# Patient Record
Sex: Male | Born: 1995 | Race: White | Hispanic: Yes | Marital: Single
Health system: Southern US, Community
[De-identification: ages and names within clinical notes are randomized; demographics above are authoritative.]

---
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
## 2020-01-16 ENCOUNTER — Other Ambulatory Visit: Payer: Self-pay

## 2020-01-16 ENCOUNTER — Encounter (HOSPITAL_COMMUNITY): Payer: Self-pay

## 2020-01-16 ENCOUNTER — Ambulatory Visit (HOSPITAL_COMMUNITY)
Admission: EM | Admit: 2020-01-16 | Discharge: 2020-01-16 | Disposition: A | Discharge status: Home / Self Care | Payer: Self-pay | Attending: Emergency Medicine | Admitting: Emergency Medicine

## 2020-01-16 DIAGNOSIS — H1031 Unspecified acute conjunctivitis, right eye: Secondary | ICD-10-CM

## 2020-01-16 DIAGNOSIS — T1591XA Foreign body on external eye, part unspecified, right eye, initial encounter: Secondary | ICD-10-CM

## 2020-01-16 MED ORDER — NEOMYCIN-POLYMYXIN-HC 3.5-10000-1 OP SUSP: 3.0000 [drp] | Freq: Four times a day (QID) | OPHTHALMIC | 0 refills | Status: AC

## 2020-01-16 NOTE — Discharge Instructions (Signed)
Use of the provided drops for the next week unless told otherwise by ophthalmology.  Please follow up with ophthalmology for recheck.

## 2020-01-16 NOTE — ED Provider Notes (Signed)
[facility]    CSN: [id number] Arrival date & time: 01/16/20  1451      History   Chief Complaint Chief Complaint  Patient presents with  . Eye Problem    HPI Binh Ardyth Harps is a 24 y.o. male.   Mivaan Ardyth Harps presents with complaints of right eye irritation with concern that there is a foreign body. He was outside yesterday when he felt a sudden onset of discomfort as if something got in the eye. Today it is red and tearing.  Tearing. No mattering. No vision changes. No photophobia. Doesn't wear contacts or glasses. States since sitting in exam room his eye feels improved.    ROS per HPI, negative if not otherwise mentioned.      History reviewed. No pertinent past medical history.  There are no problems to display for this patient.   History reviewed. No pertinent surgical history.     Home Medications    Prior to Admission medications   Medication Sig Start Date End Date Taking? Authorizing Provider  neomycin-polymyxin-hydrocortisone (CORTISPORIN) 3.5-10000-1 ophthalmic suspension Place 3 drops into the right eye 4 (four) times daily for 7 days. 01/16/20 01/23/20  Georgetta Haber, NP    Family History History reviewed. No pertinent family history.  Social History Social History   Tobacco Use  . Smoking status: Not on file  Substance Use Topics  . Alcohol use: Not on file  . Drug use: Not on file     Allergies   Patient has no known allergies.   Review of Systems Review of Systems   Physical Exam Triage Vital Signs ED Triage Vitals  Enc Vitals Group     BP 01/16/20 1531 140/83     Pulse Rate 01/16/20 1531 91     Resp 01/16/20 1531 18     Temp 01/16/20 1531 98.9 F (37.2 C)     Temp Source 01/16/20 1531 Oral     SpO2 01/16/20 1531 100 %     Weight --      Height --      Head Circumference --      Peak Flow --      Pain Score 01/16/20 1532 0     Pain Loc --      Pain Edu? --      Excl. in GC? --    No data  found.  Updated Vital Signs BP 140/83 (BP Location: Right Arm)   Pulse 91   Temp 98.9 F (37.2 C) (Oral)   Resp 18   SpO2 100%   Visual Acuity Right Eye Distance: 20/20 Left Eye Distance: 20/20 Bilateral Distance:    Right Eye Near:   Left Eye Near:    Bilateral Near:     Physical Exam Constitutional:      Appearance: He is well-developed.  Eyes:     General: Lids are normal. Lids are everted, no foreign bodies appreciated. Vision grossly intact.     Extraocular Movements: Extraocular movements intact.     Conjunctiva/sclera:     Right eye: Right conjunctiva is injected.     Pupils: Pupils are equal, round, and reactive to light.     Right eye: No fluorescein uptake.      Comments: Small black fleck to right lateral eye which is not mobile but also is not taking up fluorescein; eye nevus vs FB; conjunctiva is red; eye irrigated with wash as well as with wash cup without any change to the black fleck  Cardiovascular:     Rate and Rhythm: Normal rate.  Pulmonary:     Effort: Pulmonary effort is normal.  Skin:    General: Skin is warm and dry.  Neurological:     Mental Status: He is alert and oriented to person, place, and time.      UC Treatments / Results  Labs (all labs ordered are listed, but only abnormal results are displayed) Labs Reviewed - No data to display  EKG   Radiology No results found.  Procedures Procedures (including critical care time)  Medications Ordered in UC Medications - No data to display  Initial Impression / Assessment and Plan / UC Course  I have reviewed the triage vital signs and the nursing notes.  Pertinent labs & imaging results that were available during my care of the patient were reviewed by me and considered in my medical decision making (see chart for details).     Conjunctivitis with questionable foreign body to right lateral eye vs nevus? Follow up tomorrow with ophthalmology for recheck. Patient verbalized  understanding and agreeable to plan.   Final Clinical Impressions(s) / UC Diagnoses   Final diagnoses:  Acute conjunctivitis of right eye, unspecified acute conjunctivitis type  Foreign body of right eye, initial encounter     Discharge Instructions     Use of the provided drops for the next week unless told otherwise by ophthalmology.  Please follow up with ophthalmology for recheck.     ED Prescriptions    Medication Sig Dispense Auth. Provider   neomycin-polymyxin-hydrocortisone (CORTISPORIN) 3.5-10000-1 ophthalmic suspension Place 3 drops into the right eye 4 (four) times daily for 7 days. 7.5 mL Georgetta Haber, NP     PDMP not reviewed this encounter.   Georgetta Haber, NP 01/16/20 [phone]

## 2020-01-16 NOTE — ED Triage Notes (Signed)
Pt present right eye redness with itching and watery. Pt state there is a foreign object in his right eye. Pt notice this yesterday. Pt states he washed his eye out but the object is still located in his eye. Pt eye is red.

## 2021-01-27 ENCOUNTER — Emergency Department (HOSPITAL_COMMUNITY): Payer: No Typology Code available for payment source

## 2021-01-27 ENCOUNTER — Inpatient Hospital Stay (HOSPITAL_COMMUNITY)
Admission: EM | Admit: 2021-01-27 | Discharge: 2021-02-10 | DRG: 957 | Disposition: A | Discharge status: Home / Self Care | Payer: No Typology Code available for payment source | Attending: General Surgery | Admitting: General Surgery

## 2021-01-27 DIAGNOSIS — S42102A Fracture of unspecified part of scapula, left shoulder, initial encounter for closed fracture: Secondary | ICD-10-CM | POA: Diagnosis present

## 2021-01-27 DIAGNOSIS — Y92410 Unspecified street and highway as the place of occurrence of the external cause: Secondary | ICD-10-CM

## 2021-01-27 DIAGNOSIS — Z4682 Encounter for fitting and adjustment of non-vascular catheter: Secondary | ICD-10-CM

## 2021-01-27 DIAGNOSIS — S022XXA Fracture of nasal bones, initial encounter for closed fracture: Secondary | ICD-10-CM | POA: Diagnosis present

## 2021-01-27 DIAGNOSIS — S27331A Laceration of lung, unilateral, initial encounter: Secondary | ICD-10-CM | POA: Diagnosis present

## 2021-01-27 DIAGNOSIS — J9690 Respiratory failure, unspecified, unspecified whether with hypoxia or hypercapnia: Secondary | ICD-10-CM

## 2021-01-27 DIAGNOSIS — M40204 Unspecified kyphosis, thoracic region: Secondary | ICD-10-CM | POA: Diagnosis present

## 2021-01-27 DIAGNOSIS — J939 Pneumothorax, unspecified: Secondary | ICD-10-CM | POA: Diagnosis present

## 2021-01-27 DIAGNOSIS — S36112A Contusion of liver, initial encounter: Secondary | ICD-10-CM | POA: Diagnosis present

## 2021-01-27 DIAGNOSIS — K760 Fatty (change of) liver, not elsewhere classified: Secondary | ICD-10-CM | POA: Diagnosis present

## 2021-01-27 DIAGNOSIS — S32029A Unspecified fracture of second lumbar vertebra, initial encounter for closed fracture: Secondary | ICD-10-CM | POA: Diagnosis present

## 2021-01-27 DIAGNOSIS — S42111A Displaced fracture of body of scapula, right shoulder, initial encounter for closed fracture: Secondary | ICD-10-CM

## 2021-01-27 DIAGNOSIS — Z20822 Contact with and (suspected) exposure to covid-19: Secondary | ICD-10-CM | POA: Diagnosis present

## 2021-01-27 DIAGNOSIS — J982 Interstitial emphysema: Secondary | ICD-10-CM | POA: Diagnosis present

## 2021-01-27 DIAGNOSIS — Z419 Encounter for procedure for purposes other than remedying health state, unspecified: Secondary | ICD-10-CM

## 2021-01-27 DIAGNOSIS — G9600 Cerebrospinal fluid leak, unspecified: Secondary | ICD-10-CM | POA: Diagnosis present

## 2021-01-27 DIAGNOSIS — S271XXA Traumatic hemothorax, initial encounter: Principal | ICD-10-CM | POA: Diagnosis present

## 2021-01-27 DIAGNOSIS — J9601 Acute respiratory failure with hypoxia: Secondary | ICD-10-CM | POA: Diagnosis present

## 2021-01-27 DIAGNOSIS — S32019A Unspecified fracture of first lumbar vertebra, initial encounter for closed fracture: Secondary | ICD-10-CM | POA: Diagnosis present

## 2021-01-27 DIAGNOSIS — S2241XA Multiple fractures of ribs, right side, initial encounter for closed fracture: Secondary | ICD-10-CM | POA: Diagnosis present

## 2021-01-27 DIAGNOSIS — S42115A Nondisplaced fracture of body of scapula, left shoulder, initial encounter for closed fracture: Secondary | ICD-10-CM

## 2021-01-27 DIAGNOSIS — M4325 Fusion of spine, thoracolumbar region: Secondary | ICD-10-CM | POA: Diagnosis present

## 2021-01-27 DIAGNOSIS — Z8709 Personal history of other diseases of the respiratory system: Secondary | ICD-10-CM

## 2021-01-27 DIAGNOSIS — Z23 Encounter for immunization: Secondary | ICD-10-CM

## 2021-01-27 DIAGNOSIS — S27329A Contusion of lung, unspecified, initial encounter: Secondary | ICD-10-CM | POA: Diagnosis present

## 2021-01-27 DIAGNOSIS — S42021A Displaced fracture of shaft of right clavicle, initial encounter for closed fracture: Secondary | ICD-10-CM | POA: Diagnosis present

## 2021-01-27 DIAGNOSIS — S22068A Other fracture of T7-T8 thoracic vertebra, initial encounter for closed fracture: Secondary | ICD-10-CM | POA: Diagnosis present

## 2021-01-27 DIAGNOSIS — S27321A Contusion of lung, unilateral, initial encounter: Secondary | ICD-10-CM | POA: Diagnosis present

## 2021-01-27 DIAGNOSIS — S42009A Fracture of unspecified part of unspecified clavicle, initial encounter for closed fracture: Secondary | ICD-10-CM

## 2021-01-27 DIAGNOSIS — R000 Tachycardia, unspecified: Secondary | ICD-10-CM | POA: Diagnosis present

## 2021-01-27 DIAGNOSIS — I959 Hypotension, unspecified: Secondary | ICD-10-CM | POA: Diagnosis present

## 2021-01-27 DIAGNOSIS — S22088A Other fracture of T11-T12 vertebra, initial encounter for closed fracture: Secondary | ICD-10-CM | POA: Diagnosis present

## 2021-01-27 DIAGNOSIS — S42101A Fracture of unspecified part of scapula, right shoulder, initial encounter for closed fracture: Secondary | ICD-10-CM | POA: Diagnosis present

## 2021-01-27 DIAGNOSIS — R339 Retention of urine, unspecified: Secondary | ICD-10-CM | POA: Diagnosis not present

## 2021-01-27 DIAGNOSIS — T1490XA Injury, unspecified, initial encounter: Secondary | ICD-10-CM

## 2021-01-27 DIAGNOSIS — S22078A Other fracture of T9-T10 vertebra, initial encounter for closed fracture: Secondary | ICD-10-CM | POA: Diagnosis present

## 2021-01-27 MED ORDER — CEFAZOLIN SODIUM-DEXTROSE 1-4 GM/50ML-% IV SOLN
1.0000 g | Freq: Once | INTRAVENOUS | Status: AC
  Administered 2021-01-27: 1 g via INTRAVENOUS

## 2021-01-27 MED ORDER — ROCURONIUM BROMIDE 50 MG/5ML IV SOLN
INTRAVENOUS | Status: AC | PRN
  Administered 2021-01-27: 100 mg via INTRAVENOUS

## 2021-01-27 MED ORDER — SODIUM CHLORIDE 0.9 % IV SOLN
INTRAVENOUS | Status: AC | PRN
  Administered 2021-01-27: 999 mL/h via INTRAVENOUS

## 2021-01-27 MED ORDER — FENTANYL CITRATE PF 50 MCG/ML IJ SOSY
100.0000 ug | PREFILLED_SYRINGE | Freq: Once | INTRAMUSCULAR | Status: AC
  Administered 2021-01-27: 100 ug via INTRAVENOUS

## 2021-01-27 MED ORDER — IOHEXOL 350 MG/ML SOLN
100.0000 mL | Freq: Once | INTRAVENOUS | Status: AC | PRN
  Administered 2021-01-27: 100 mL via INTRAVENOUS

## 2021-01-27 MED ORDER — ETOMIDATE 2 MG/ML IV SOLN
INTRAVENOUS | Status: AC | PRN
  Administered 2021-01-27: 10 mg via INTRAVENOUS

## 2021-01-27 NOTE — Progress Notes (Signed)
Orthopedic Tech Progress Note Patient Details:  Korrey Schleicher 03-25-1995 [id number]  Patient ID: Kalman Nylen, male   DOB: 07-26-1995, 25 y.o.   MRN: [id number] I attended trauma page. Trinna Post 01/27/2021, 11:59 PM

## 2021-01-27 NOTE — ED Notes (Signed)
Patient transported to CT 
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
## 2021-01-28 ENCOUNTER — Encounter (HOSPITAL_COMMUNITY): Payer: Self-pay

## 2021-01-28 ENCOUNTER — Inpatient Hospital Stay (HOSPITAL_COMMUNITY): Payer: No Typology Code available for payment source

## 2021-01-28 DIAGNOSIS — S42101A Fracture of unspecified part of scapula, right shoulder, initial encounter for closed fracture: Secondary | ICD-10-CM | POA: Diagnosis present

## 2021-01-28 DIAGNOSIS — J939 Pneumothorax, unspecified: Secondary | ICD-10-CM | POA: Diagnosis present

## 2021-01-28 DIAGNOSIS — S36112A Contusion of liver, initial encounter: Secondary | ICD-10-CM | POA: Diagnosis present

## 2021-01-28 DIAGNOSIS — S32019A Unspecified fracture of first lumbar vertebra, initial encounter for closed fracture: Secondary | ICD-10-CM | POA: Diagnosis present

## 2021-01-28 DIAGNOSIS — S32029A Unspecified fracture of second lumbar vertebra, initial encounter for closed fracture: Secondary | ICD-10-CM | POA: Diagnosis present

## 2021-01-28 DIAGNOSIS — J982 Interstitial emphysema: Secondary | ICD-10-CM | POA: Diagnosis present

## 2021-01-28 DIAGNOSIS — S42111A Displaced fracture of body of scapula, right shoulder, initial encounter for closed fracture: Secondary | ICD-10-CM

## 2021-01-28 DIAGNOSIS — S42115A Nondisplaced fracture of body of scapula, left shoulder, initial encounter for closed fracture: Secondary | ICD-10-CM

## 2021-01-28 DIAGNOSIS — S42021A Displaced fracture of shaft of right clavicle, initial encounter for closed fracture: Secondary | ICD-10-CM | POA: Diagnosis present

## 2021-01-28 DIAGNOSIS — Z23 Encounter for immunization: Secondary | ICD-10-CM | POA: Diagnosis not present

## 2021-01-28 DIAGNOSIS — S271XXA Traumatic hemothorax, initial encounter: Secondary | ICD-10-CM | POA: Diagnosis present

## 2021-01-28 DIAGNOSIS — I959 Hypotension, unspecified: Secondary | ICD-10-CM | POA: Diagnosis present

## 2021-01-28 DIAGNOSIS — S022XXA Fracture of nasal bones, initial encounter for closed fracture: Secondary | ICD-10-CM | POA: Diagnosis present

## 2021-01-28 DIAGNOSIS — Z20822 Contact with and (suspected) exposure to covid-19: Secondary | ICD-10-CM | POA: Diagnosis present

## 2021-01-28 DIAGNOSIS — J9690 Respiratory failure, unspecified, unspecified whether with hypoxia or hypercapnia: Secondary | ICD-10-CM | POA: Diagnosis present

## 2021-01-28 DIAGNOSIS — S27321A Contusion of lung, unilateral, initial encounter: Secondary | ICD-10-CM | POA: Diagnosis present

## 2021-01-28 DIAGNOSIS — S22068A Other fracture of T7-T8 thoracic vertebra, initial encounter for closed fracture: Secondary | ICD-10-CM | POA: Diagnosis present

## 2021-01-28 DIAGNOSIS — S42102A Fracture of unspecified part of scapula, left shoulder, initial encounter for closed fracture: Secondary | ICD-10-CM | POA: Diagnosis present

## 2021-01-28 DIAGNOSIS — K760 Fatty (change of) liver, not elsewhere classified: Secondary | ICD-10-CM | POA: Diagnosis present

## 2021-01-28 DIAGNOSIS — S27331A Laceration of lung, unilateral, initial encounter: Secondary | ICD-10-CM | POA: Diagnosis present

## 2021-01-28 DIAGNOSIS — G9600 Cerebrospinal fluid leak, unspecified: Secondary | ICD-10-CM | POA: Diagnosis present

## 2021-01-28 DIAGNOSIS — S2241XA Multiple fractures of ribs, right side, initial encounter for closed fracture: Secondary | ICD-10-CM | POA: Diagnosis present

## 2021-01-28 DIAGNOSIS — R339 Retention of urine, unspecified: Secondary | ICD-10-CM | POA: Diagnosis not present

## 2021-01-28 DIAGNOSIS — S22078A Other fracture of T9-T10 vertebra, initial encounter for closed fracture: Secondary | ICD-10-CM | POA: Diagnosis present

## 2021-01-28 DIAGNOSIS — M40204 Unspecified kyphosis, thoracic region: Secondary | ICD-10-CM | POA: Diagnosis present

## 2021-01-28 DIAGNOSIS — S27329A Contusion of lung, unspecified, initial encounter: Secondary | ICD-10-CM | POA: Diagnosis present

## 2021-01-28 DIAGNOSIS — S22088A Other fracture of T11-T12 vertebra, initial encounter for closed fracture: Secondary | ICD-10-CM | POA: Diagnosis present

## 2021-01-28 DIAGNOSIS — J9601 Acute respiratory failure with hypoxia: Secondary | ICD-10-CM | POA: Diagnosis present

## 2021-01-28 DIAGNOSIS — Y92410 Unspecified street and highway as the place of occurrence of the external cause: Secondary | ICD-10-CM | POA: Diagnosis not present

## 2021-01-28 DIAGNOSIS — S42009A Fracture of unspecified part of unspecified clavicle, initial encounter for closed fracture: Secondary | ICD-10-CM

## 2021-01-28 LAB — URINALYSIS, ROUTINE W REFLEX MICROSCOPIC
Bilirubin Urine: NEGATIVE
Glucose, UA: NEGATIVE mg/dL
Ketones, ur: NEGATIVE mg/dL
Leukocytes,Ua: NEGATIVE
Nitrite: NEGATIVE
Protein, ur: NEGATIVE mg/dL
Specific Gravity, Urine: 1.004 — ABNORMAL LOW (ref 1.005–1.030)
pH: 5 (ref 5.0–8.0)

## 2021-01-28 LAB — I-STAT ARTERIAL BLOOD GAS, ED
Acid-base deficit: 14 mmol/L — ABNORMAL HIGH (ref 0.0–2.0)
Bicarbonate: 14.6 mmol/L — ABNORMAL LOW (ref 20.0–28.0)
Calcium, Ion: 1.01 mmol/L — ABNORMAL LOW (ref 1.15–1.40)
HCT: 43 % (ref 39.0–52.0)
Hemoglobin: 14.6 g/dL (ref 13.0–17.0)
O2 Saturation: 99 %
Patient temperature: 97.1
Potassium: 2.8 mmol/L — ABNORMAL LOW (ref 3.5–5.1)
Sodium: 146 mmol/L — ABNORMAL HIGH (ref 135–145)
TCO2: 16 mmol/L — ABNORMAL LOW (ref 22–32)
pCO2 arterial: 43.1 mmHg (ref 32.0–48.0)
pH, Arterial: 7.133 — CL (ref 7.350–7.450)
pO2, Arterial: 155 mmHg — ABNORMAL HIGH (ref 83.0–108.0)

## 2021-01-28 LAB — TYPE AND SCREEN
ABO/RH(D): O POS
Antibody Screen: NEGATIVE
Unit division: 0
Unit division: 0

## 2021-01-28 LAB — COMPREHENSIVE METABOLIC PANEL
ALT: 1244 U/L — ABNORMAL HIGH (ref 0–44)
AST: 1395 U/L — ABNORMAL HIGH (ref 15–41)
Albumin: 4 g/dL (ref 3.5–5.0)
Alkaline Phosphatase: 151 U/L — ABNORMAL HIGH (ref 38–126)
Anion gap: 20 — ABNORMAL HIGH (ref 5–15)
BUN: 8 mg/dL (ref 6–20)
CO2: 12 mmol/L — ABNORMAL LOW (ref 22–32)
Calcium: 8.4 mg/dL — ABNORMAL LOW (ref 8.9–10.3)
Chloride: 111 mmol/L (ref 98–111)
Creatinine, Ser: 1.03 mg/dL (ref 0.61–1.24)
GFR, Estimated: >60 mL/min (ref >60)
Glucose, Bld: 187 mg/dL — ABNORMAL HIGH (ref 70–99)
Potassium: 3.2 mmol/L — ABNORMAL LOW (ref 3.5–5.1)
Sodium: 143 mmol/L (ref 135–145)
Total Bilirubin: 0.5 mg/dL (ref 0.3–1.2)
Total Protein: 7.4 g/dL (ref 6.5–8.1)

## 2021-01-28 LAB — I-STAT CHEM 8, ED
BUN: 8 mg/dL (ref 6–20)
Calcium, Ion: 0.94 mmol/L — ABNORMAL LOW (ref 1.15–1.40)
Chloride: 113 mmol/L — ABNORMAL HIGH (ref 98–111)
Creatinine, Ser: 1.3 mg/dL — ABNORMAL HIGH (ref 0.61–1.24)
Glucose, Bld: 179 mg/dL — ABNORMAL HIGH (ref 70–99)
HCT: 47 % (ref 39.0–52.0)
Hemoglobin: 16 g/dL (ref 13.0–17.0)
Potassium: 3.2 mmol/L — ABNORMAL LOW (ref 3.5–5.1)
Sodium: 144 mmol/L (ref 135–145)
TCO2: 14 mmol/L — ABNORMAL LOW (ref 22–32)

## 2021-01-28 LAB — CBC
HCT: 46.1 % (ref 39.0–52.0)
HCT: 48 % (ref 39.0–52.0)
HCT: 54.7 % — ABNORMAL HIGH (ref 39.0–52.0)
Hemoglobin: 15.3 g/dL (ref 13.0–17.0)
Hemoglobin: 16.7 g/dL (ref 13.0–17.0)
Hemoglobin: 17.9 g/dL — ABNORMAL HIGH (ref 13.0–17.0)
MCH: 29.4 pg (ref 26.0–34.0)
MCH: 29.4 pg (ref 26.0–34.0)
MCH: 30.2 pg (ref 26.0–34.0)
MCHC: 32.7 g/dL (ref 30.0–36.0)
MCHC: 33.2 g/dL (ref 30.0–36.0)
MCHC: 34.8 g/dL (ref 30.0–36.0)
MCV: 86.8 fL (ref 80.0–100.0)
MCV: 88.5 fL (ref 80.0–100.0)
MCV: 90 fL (ref 80.0–100.0)
Platelets: 232 10*3/uL (ref 150–400)
Platelets: 300 10*3/uL (ref 150–400)
Platelets: 398 10*3/uL (ref 150–400)
RBC: 5.21 MIL/uL (ref 4.22–5.81)
RBC: 5.53 MIL/uL (ref 4.22–5.81)
RBC: 6.08 MIL/uL — ABNORMAL HIGH (ref 4.22–5.81)
RDW: 13.2 % (ref 11.5–15.5)
RDW: 13.3 % (ref 11.5–15.5)
RDW: 14 % (ref 11.5–15.5)
WBC: 14.9 10*3/uL — ABNORMAL HIGH (ref 4.0–10.5)
WBC: 16 10*3/uL — ABNORMAL HIGH (ref 4.0–10.5)
WBC: 6.5 10*3/uL (ref 4.0–10.5)
nRBC: 0 % (ref 0.0–0.2)
nRBC: 0 % (ref 0.0–0.2)
nRBC: 0.5 % — ABNORMAL HIGH (ref 0.0–0.2)

## 2021-01-28 LAB — RESP PANEL BY RT-PCR (FLU A&B, COVID) ARPGX2
Influenza A by PCR: NEGATIVE
Influenza B by PCR: NEGATIVE
SARS Coronavirus 2 by RT PCR: NEGATIVE

## 2021-01-28 LAB — BPAM RBC
Blood Product Expiration Date: 202212042359
Blood Product Expiration Date: 202212182359
ISSUE DATE / TIME: 202211262333
ISSUE DATE / TIME: 202211262338
Unit Type and Rh: 5100
Unit Type and Rh: 5100

## 2021-01-28 LAB — ETHANOL: Alcohol, Ethyl (B): 322 mg/dL (ref <10)

## 2021-01-28 LAB — PROTIME-INR
INR: 1.1 (ref 0.8–1.2)
Prothrombin Time: 14.1 seconds (ref 11.4–15.2)

## 2021-01-28 LAB — LACTIC ACID, PLASMA
Lactic Acid, Venous: 6.1 mmol/L (ref 0.5–1.9)
Lactic Acid, Venous: 3.2 mmol/L (ref 0.5–1.9)
Lactic Acid, Venous: 2.4 mmol/L (ref 0.5–1.9)

## 2021-01-28 LAB — RAPID URINE DRUG SCREEN, HOSP PERFORMED
Amphetamines: NOT DETECTED
Barbiturates: NOT DETECTED
Benzodiazepines: NOT DETECTED
Cocaine: NOT DETECTED
Opiates: NOT DETECTED
Tetrahydrocannabinol: NOT DETECTED

## 2021-01-28 LAB — ABO/RH: ABO/RH(D): O POS

## 2021-01-28 LAB — HIV ANTIBODY (ROUTINE TESTING W REFLEX): HIV Screen 4th Generation wRfx: NONREACTIVE

## 2021-01-28 LAB — BLOOD PRODUCT ORDER (VERBAL) VERIFICATION

## 2021-01-28 LAB — MRSA NEXT GEN BY PCR, NASAL: MRSA by PCR Next Gen: NOT DETECTED

## 2021-01-28 MED ORDER — SODIUM CHLORIDE 0.9 % IV SOLN: INTRAVENOUS | Status: DC

## 2021-01-28 MED ORDER — ACETAMINOPHEN 325 MG PO TABS
650.0000 mg | ORAL_TABLET | ORAL | Status: DC | PRN
  Administered 2021-01-28: 650 mg
  Filled 2021-01-28: qty 2

## 2021-01-28 MED ORDER — FENTANYL 2500MCG IN NS 250ML (10MCG/ML) PREMIX INFUSION
50.0000 ug/h | INTRAVENOUS | Status: DC
Start: 2021-01-28 — End: 2021-02-02
  Administered 2021-01-28: 50 ug/h via INTRAVENOUS
  Administered 2021-01-29: 100 ug/h via INTRAVENOUS
  Administered 2021-01-29 – 2021-02-01 (×4): 150 ug/h via INTRAVENOUS
  Administered 2021-02-01: 175 ug/h via INTRAVENOUS
  Administered 2021-02-02: 150 ug/h via INTRAVENOUS
  Filled 2021-01-28 (×8): qty 250

## 2021-01-28 MED ORDER — POTASSIUM CHLORIDE 20 MEQ PO PACK
40.0000 meq | PACK | Freq: Two times a day (BID) | ORAL | Status: AC
  Administered 2021-01-28: 40 meq
  Filled 2021-01-28: qty 2

## 2021-01-28 MED ORDER — METOPROLOL TARTRATE 5 MG/5ML IV SOLN: 5.0000 mg | Freq: Four times a day (QID) | INTRAVENOUS | Status: DC | PRN

## 2021-01-28 MED ORDER — FENTANYL BOLUS VIA INFUSION
50.0000 ug | INTRAVENOUS | Status: DC | PRN
  Administered 2021-01-28 – 2021-02-02 (×8): 100 ug via INTRAVENOUS
  Filled 2021-01-28: qty 100

## 2021-01-28 MED ORDER — CHLORHEXIDINE GLUCONATE 0.12% ORAL RINSE (MEDLINE KIT): 15.0000 mL | Freq: Two times a day (BID) | OROMUCOSAL | Status: DC

## 2021-01-28 MED ORDER — ENOXAPARIN SODIUM 30 MG/0.3ML IJ SOSY
30.0000 mg | PREFILLED_SYRINGE | Freq: Two times a day (BID) | INTRAMUSCULAR | Status: DC
  Filled 2021-01-28 (×2): qty 0.3

## 2021-01-28 MED ORDER — ORAL CARE MOUTH RINSE: 15.0000 mL | OROMUCOSAL | Status: DC

## 2021-01-28 MED ORDER — CHLORHEXIDINE GLUCONATE CLOTH 2 % EX PADS: 6.0000 | MEDICATED_PAD | Freq: Every day | CUTANEOUS | Status: DC

## 2021-01-28 MED ORDER — SODIUM CHLORIDE 0.9 % IV BOLUS
1000.0000 mL | Freq: Once | INTRAVENOUS | Status: AC
  Administered 2021-01-28: 1000 mL via INTRAVENOUS

## 2021-01-28 MED ORDER — ACETAMINOPHEN 160 MG/5ML PO SOLN
325.0000 mg | Freq: Four times a day (QID) | ORAL | Status: DC
  Administered 2021-01-28 – 2021-01-29 (×6): 325 mg
  Filled 2021-01-28 (×5): qty 20.3

## 2021-01-28 MED ORDER — PANTOPRAZOLE SODIUM 40 MG PO TBEC: 40.0000 mg | DELAYED_RELEASE_TABLET | Freq: Every day | ORAL | Status: DC

## 2021-01-28 MED ORDER — POTASSIUM CHLORIDE 20 MEQ PO PACK: 40.0000 meq | PACK | Freq: Two times a day (BID) | ORAL | Status: DC

## 2021-01-28 MED ORDER — CALCIUM GLUCONATE-NACL 2-0.675 GM/100ML-% IV SOLN
2.0000 g | Freq: Once | INTRAVENOUS | Status: AC
  Administered 2021-01-28: 2000 mg via INTRAVENOUS
  Filled 2021-01-28: qty 100

## 2021-01-28 MED ORDER — ORAL CARE MOUTH RINSE
15.0000 mL | OROMUCOSAL | Status: DC
  Administered 2021-01-28 – 2021-02-02 (×53): 15 mL via OROMUCOSAL

## 2021-01-28 MED ORDER — ACETAMINOPHEN 325 MG PO TABS: 650.0000 mg | ORAL_TABLET | ORAL | Status: DC | PRN

## 2021-01-28 MED ORDER — ONDANSETRON 4 MG PO TBDP: 4.0000 mg | ORAL_TABLET | Freq: Four times a day (QID) | ORAL | Status: DC | PRN

## 2021-01-28 MED ORDER — POLYETHYLENE GLYCOL 3350 17 G PO PACK
17.0000 g | PACK | Freq: Every day | ORAL | Status: DC
  Administered 2021-01-28 – 2021-02-01 (×4): 17 g
  Filled 2021-01-28 (×4): qty 1

## 2021-01-28 MED ORDER — POTASSIUM CHLORIDE 10 MEQ/100ML IV SOLN
10.0000 meq | INTRAVENOUS | Status: AC
  Administered 2021-01-28 (×2): 10 meq via INTRAVENOUS
  Filled 2021-01-28 (×2): qty 100

## 2021-01-28 MED ORDER — PANTOPRAZOLE SODIUM 40 MG IV SOLR
40.0000 mg | Freq: Every day | INTRAVENOUS | Status: DC
  Administered 2021-01-28 – 2021-02-01 (×4): 40 mg via INTRAVENOUS
  Filled 2021-01-28 (×4): qty 40

## 2021-01-28 MED ORDER — CHLORHEXIDINE GLUCONATE CLOTH 2 % EX PADS
6.0000 | MEDICATED_PAD | Freq: Every day | CUTANEOUS | Status: DC
  Administered 2021-01-28 – 2021-01-29 (×2): 6 via TOPICAL

## 2021-01-28 MED ORDER — TETANUS-DIPHTH-ACELL PERTUSSIS 5-2.5-18.5 LF-MCG/0.5 IM SUSY
0.5000 mL | PREFILLED_SYRINGE | Freq: Once | INTRAMUSCULAR | Status: AC
  Administered 2021-01-28: 0.5 mL via INTRAMUSCULAR
  Filled 2021-01-28: qty 0.5

## 2021-01-28 MED ORDER — FENTANYL CITRATE PF 50 MCG/ML IJ SOSY
50.0000 ug | PREFILLED_SYRINGE | Freq: Once | INTRAMUSCULAR | Status: AC
  Administered 2021-01-28: 50 ug via INTRAVENOUS

## 2021-01-28 MED ORDER — ONDANSETRON HCL 4 MG/2ML IJ SOLN
4.0000 mg | Freq: Four times a day (QID) | INTRAMUSCULAR | Status: DC | PRN
  Administered 2021-02-07: 4 mg via INTRAVENOUS

## 2021-01-28 MED ORDER — PROPOFOL 1000 MG/100ML IV EMUL
0.0000 ug/kg/min | INTRAVENOUS | Status: DC
  Administered 2021-01-28: 10 ug/kg/min via INTRAVENOUS
  Administered 2021-01-28 – 2021-01-29 (×4): 40 ug/kg/min via INTRAVENOUS
  Administered 2021-01-29: 35 ug/kg/min via INTRAVENOUS
  Administered 2021-01-29: 30 ug/kg/min via INTRAVENOUS
  Administered 2021-01-30 – 2021-01-31 (×5): 40 ug/kg/min via INTRAVENOUS
  Administered 2021-01-31: 50 ug/kg/min via INTRAVENOUS
  Administered 2021-01-31: 40 ug/kg/min via INTRAVENOUS
  Administered 2021-02-01 (×2): 50 ug/kg/min via INTRAVENOUS
  Administered 2021-02-01: 40 ug/kg/min via INTRAVENOUS
  Administered 2021-02-01 (×2): 50 ug/kg/min via INTRAVENOUS
  Administered 2021-02-02: 40 ug/kg/min via INTRAVENOUS
  Administered 2021-02-02: 50 ug/kg/min via INTRAVENOUS
  Filled 2021-01-28 (×7): qty 100
  Filled 2021-01-28: qty 200
  Filled 2021-01-28 (×5): qty 100
  Filled 2021-01-28: qty 200
  Filled 2021-01-28 (×3): qty 100
  Filled 2021-01-28: qty 200

## 2021-01-28 MED ORDER — MORPHINE SULFATE (PF) 2 MG/ML IV SOLN
2.0000 mg | INTRAVENOUS | Status: DC | PRN
  Administered 2021-01-31 – 2021-02-03 (×11): 4 mg via INTRAVENOUS
  Filled 2021-01-28 (×11): qty 2

## 2021-01-28 MED ORDER — DOCUSATE SODIUM 50 MG/5ML PO LIQD
100.0000 mg | Freq: Two times a day (BID) | ORAL | Status: DC
  Administered 2021-01-28 – 2021-02-01 (×10): 100 mg
  Filled 2021-01-28 (×10): qty 10

## 2021-01-28 NOTE — ED Notes (Signed)
Trauma End 
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
## 2021-01-28 NOTE — Progress Notes (Signed)
OT Cancellation Note  Patient Details Name: Jesus Spencer MRN: [id number] DOB: 09/25/1995   Cancelled Treatment:    Reason Eval/Treat Not Completed: Medical issues which prohibited therapy (Intubated. Awaiting surgery. Will return as schedule allows.)  [name] [name] MSOT, OTR/L Acute Rehab Pager: [phone] Office: [phone] 01/28/2021, 9:36 AM

## 2021-01-28 NOTE — ED Notes (Signed)
Called to RN [employer]
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
## 2021-01-28 NOTE — Progress Notes (Signed)
Discussed spine injuries with NSGY PA (Meyran)
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
## 2021-01-28 NOTE — Progress Notes (Signed)
PT Cancellation Note  Patient Details Name: Jesus Spencer MRN: [id number] DOB: 1995/04/10   Cancelled Treatment:    Reason Eval/Treat Not Completed: Medical issues which prohibited therapy. Pt remains intubated and awaiting possible surgery (today or tomorrow). PT will continue to follow-up with pt acutely and await medical appropriateness.    Alessandra Bevels Haakon Titsworth 01/28/2021, 9:25 AM

## 2021-01-28 NOTE — Progress Notes (Signed)
Temperature foley removed for spine MRI since it is not compatible. Will attempt voiding trial with condom cath. Will address retention with MD if necessary.

## 2021-01-28 NOTE — Discharge Instructions (Addendum)
     Discharge Instructions    Attending Surgeon: Huel Cote, MD Office Phone Number: [phone]   Diagnosis and Procedures:    Surgeries Performed: Right clavicle open reduction internal fixation  You will be nonweightbearing in a sling on the right upper extremity for 2 weeks following surgery. Please schedule followup with Dr. Steward Drone.

## 2021-01-29 ENCOUNTER — Inpatient Hospital Stay (HOSPITAL_COMMUNITY): Payer: No Typology Code available for payment source

## 2021-01-29 LAB — COMPREHENSIVE METABOLIC PANEL
ALT: 570 U/L — ABNORMAL HIGH (ref 0–44)
AST: 396 U/L — ABNORMAL HIGH (ref 15–41)
Albumin: 2.4 g/dL — ABNORMAL LOW (ref 3.5–5.0)
Alkaline Phosphatase: 62 U/L (ref 38–126)
Anion gap: 7 (ref 5–15)
BUN: 9 mg/dL (ref 6–20)
CO2: 21 mmol/L — ABNORMAL LOW (ref 22–32)
Calcium: 8.4 mg/dL — ABNORMAL LOW (ref 8.9–10.3)
Chloride: 114 mmol/L — ABNORMAL HIGH (ref 98–111)
Creatinine, Ser: 0.79 mg/dL (ref 0.61–1.24)
GFR, Estimated: >60 mL/min (ref >60)
Glucose, Bld: 115 mg/dL — ABNORMAL HIGH (ref 70–99)
Potassium: 3.8 mmol/L (ref 3.5–5.1)
Sodium: 142 mmol/L (ref 135–145)
Total Bilirubin: 0.8 mg/dL (ref 0.3–1.2)
Total Protein: 4.9 g/dL — ABNORMAL LOW (ref 6.5–8.1)

## 2021-01-29 LAB — GLUCOSE, CAPILLARY
Glucose-Capillary: 106 mg/dL — ABNORMAL HIGH (ref 70–99)
Glucose-Capillary: 113 mg/dL — ABNORMAL HIGH (ref 70–99)
Glucose-Capillary: 95 mg/dL (ref 70–99)

## 2021-01-29 LAB — MAGNESIUM: Magnesium: 1.8 mg/dL (ref 1.7–2.4)

## 2021-01-29 LAB — TRIGLYCERIDES: Triglycerides: 207 mg/dL — ABNORMAL HIGH (ref <150)

## 2021-01-29 MED ORDER — METHOCARBAMOL 500 MG PO TABS
1000.0000 mg | ORAL_TABLET | Freq: Three times a day (TID) | ORAL | Status: DC
  Administered 2021-01-29 – 2021-01-30 (×5): 1000 mg
  Filled 2021-01-29 (×5): qty 2

## 2021-01-29 MED ORDER — VITAL HIGH PROTEIN PO LIQD
1000.0000 mL | ORAL | Status: DC
  Administered 2021-01-29: 1000 mL

## 2021-01-29 MED ORDER — PROSOURCE TF PO LIQD
45.0000 mL | Freq: Two times a day (BID) | ORAL | Status: DC
  Administered 2021-01-29 – 2021-01-30 (×3): 45 mL
  Filled 2021-01-29 (×4): qty 45

## 2021-01-29 MED ORDER — ACETAMINOPHEN 500 MG PO TABS
1000.0000 mg | ORAL_TABLET | Freq: Four times a day (QID) | ORAL | Status: DC
  Administered 2021-01-29 – 2021-02-01 (×10): 1000 mg
  Filled 2021-01-29 (×11): qty 2

## 2021-01-29 MED ORDER — OXYCODONE HCL 5 MG/5ML PO SOLN
5.0000 mg | ORAL | Status: DC | PRN
Start: 2021-01-29 — End: 2021-02-03
  Administered 2021-01-29: 10 mg
  Administered 2021-01-30: 5 mg
  Filled 2021-01-29: qty 5
  Filled 2021-01-29 (×2): qty 10

## 2021-01-29 NOTE — Progress Notes (Signed)
PT Cancellation Note  Patient Details Name: Jesus Spencer MRN: [id number] DOB: January 01, 1996   Cancelled Treatment:    Reason Eval/Treat Not Completed: Patient not medically ready (wait until surgery and new order). Will sign off at this time. Please reorder when appropriate after surgery. Thank you!  Lyanne Co, PT  Acute Rehab Services  Pager [phone] Office [phone]    Lawana Chambers Janeliz Prestwood 01/29/2021, 9:18 AM

## 2021-01-29 NOTE — Progress Notes (Signed)
[place] Collar removed per written order. Cortrak unsuccessful d/t machine malfunction. Will try again Wednesday after surgery hopefully. TF started through OG tube per MD request even with HOB <15 degrees. Consent signed with pt's father and a translator for Wednesday's surgery. Family's questions answered. Chinenye Katzenberger, [place] Scrape, RN
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
## 2021-01-29 NOTE — Progress Notes (Signed)
OT Cancellation Note  Patient Details Name: Jesus Spencer MRN: [id number] DOB: 05/12/1995   Cancelled Treatment:    Reason Eval/Treat Not Completed: Patient not medically ready (Pt on spinal precautions/lay flat/log roll. Anticipating spinal surgery 11/30. Will sign off at this time. Please reorder when medically appropriate. Thanks)  Burnett Corrente [name], OT/L   Acute OT Clinical Specialist Acute Rehabilitation Services Pager [phone] Office [phone]   01/29/2021, 9:10 AM
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
## 2021-01-30 ENCOUNTER — Inpatient Hospital Stay (HOSPITAL_COMMUNITY): Payer: No Typology Code available for payment source

## 2021-01-30 LAB — GLUCOSE, CAPILLARY
Glucose-Capillary: 135 mg/dL — ABNORMAL HIGH (ref 70–99)
Glucose-Capillary: 108 mg/dL — ABNORMAL HIGH (ref 70–99)
Glucose-Capillary: 96 mg/dL (ref 70–99)
Glucose-Capillary: 103 mg/dL — ABNORMAL HIGH (ref 70–99)
Glucose-Capillary: 110 mg/dL — ABNORMAL HIGH (ref 70–99)
Glucose-Capillary: 106 mg/dL — ABNORMAL HIGH (ref 70–99)

## 2021-01-30 MED ORDER — PIVOT 1.5 CAL PO LIQD
1000.0000 mL | ORAL | Status: DC
Start: 2021-01-30 — End: 2021-01-30

## 2021-01-30 MED ORDER — PROSOURCE TF PO LIQD
90.0000 mL | Freq: Two times a day (BID) | ORAL | Status: DC
  Administered 2021-01-30 – 2021-02-01 (×4): 90 mL
  Filled 2021-01-30 (×3): qty 90

## 2021-01-30 MED ORDER — VITAL 1.5 CAL PO LIQD
1000.0000 mL | ORAL | Status: DC
  Administered 2021-01-30 – 2021-02-01 (×3): 1000 mL
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
## 2021-01-31 ENCOUNTER — Inpatient Hospital Stay (HOSPITAL_COMMUNITY): Payer: No Typology Code available for payment source

## 2021-01-31 ENCOUNTER — Inpatient Hospital Stay (HOSPITAL_COMMUNITY): Payer: No Typology Code available for payment source | Admitting: Anesthesiology

## 2021-01-31 ENCOUNTER — Inpatient Hospital Stay (HOSPITAL_COMMUNITY): Admission: EM | Disposition: A | Discharge status: Home / Self Care | Payer: Self-pay | Source: Home / Self Care

## 2021-01-31 ENCOUNTER — Encounter (HOSPITAL_COMMUNITY): Payer: Self-pay

## 2021-01-31 DIAGNOSIS — M4325 Fusion of spine, thoracolumbar region: Secondary | ICD-10-CM | POA: Diagnosis present

## 2021-01-31 LAB — GLUCOSE, CAPILLARY
Glucose-Capillary: 120 mg/dL — ABNORMAL HIGH (ref 70–99)
Glucose-Capillary: 126 mg/dL — ABNORMAL HIGH (ref 70–99)
Glucose-Capillary: 90 mg/dL (ref 70–99)
Glucose-Capillary: 93 mg/dL (ref 70–99)
Glucose-Capillary: 96 mg/dL (ref 70–99)

## 2021-01-31 LAB — POCT I-STAT 7, (LYTES, BLD GAS, ICA,H+H)
Acid-Base Excess: 2 mmol/L (ref 0.0–2.0)
Acid-Base Excess: 2 mmol/L (ref 0.0–2.0)
Bicarbonate: 27.6 mmol/L (ref 20.0–28.0)
Bicarbonate: 28.1 mmol/L — ABNORMAL HIGH (ref 20.0–28.0)
Calcium, Ion: 1.13 mmol/L — ABNORMAL LOW (ref 1.15–1.40)
Calcium, Ion: 1.13 mmol/L — ABNORMAL LOW (ref 1.15–1.40)
HCT: 30 % — ABNORMAL LOW (ref 39.0–52.0)
HCT: 27 % — ABNORMAL LOW (ref 39.0–52.0)
Hemoglobin: 10.2 g/dL — ABNORMAL LOW (ref 13.0–17.0)
Hemoglobin: 9.2 g/dL — ABNORMAL LOW (ref 13.0–17.0)
O2 Saturation: 93 %
O2 Saturation: 98 %
Patient temperature: 36.5
Patient temperature: 99.1
Potassium: 3.5 mmol/L (ref 3.5–5.1)
Potassium: 3.3 mmol/L — ABNORMAL LOW (ref 3.5–5.1)
Sodium: 138 mmol/L (ref 135–145)
Sodium: 139 mmol/L (ref 135–145)
TCO2: 29 mmol/L (ref 22–32)
TCO2: 30 mmol/L (ref 22–32)
pCO2 arterial: 45.2 mmHg (ref 32.0–48.0)
pCO2 arterial: 48.7 mmHg — ABNORMAL HIGH (ref 32.0–48.0)
pH, Arterial: 7.391 (ref 7.350–7.450)
pH, Arterial: 7.37 (ref 7.350–7.450)
pO2, Arterial: 68 mmHg — ABNORMAL LOW (ref 83.0–108.0)
pO2, Arterial: 111 mmHg — ABNORMAL HIGH (ref 83.0–108.0)

## 2021-01-31 LAB — BASIC METABOLIC PANEL
Anion gap: 4 — ABNORMAL LOW (ref 5–15)
BUN: 8 mg/dL (ref 6–20)
CO2: 30 mmol/L (ref 22–32)
Calcium: 8.3 mg/dL — ABNORMAL LOW (ref 8.9–10.3)
Chloride: 103 mmol/L (ref 98–111)
Creatinine, Ser: 0.56 mg/dL — ABNORMAL LOW (ref 0.61–1.24)
GFR, Estimated: >60 mL/min (ref >60)
Glucose, Bld: 98 mg/dL (ref 70–99)
Potassium: 3.6 mmol/L (ref 3.5–5.1)
Sodium: 137 mmol/L (ref 135–145)

## 2021-01-31 LAB — CBC
HCT: 33.9 % — ABNORMAL LOW (ref 39.0–52.0)
Hemoglobin: 11.3 g/dL — ABNORMAL LOW (ref 13.0–17.0)
MCH: 30 pg (ref 26.0–34.0)
MCHC: 33.3 g/dL (ref 30.0–36.0)
MCV: 89.9 fL (ref 80.0–100.0)
Platelets: 188 10*3/uL (ref 150–400)
RBC: 3.77 MIL/uL — ABNORMAL LOW (ref 4.22–5.81)
RDW: 13.7 % (ref 11.5–15.5)
WBC: 11.6 10*3/uL — ABNORMAL HIGH (ref 4.0–10.5)
nRBC: 0 % (ref 0.0–0.2)

## 2021-01-31 SURGERY — POSTERIOR LUMBAR FUSION 1 LEVEL
Anesthesia: General | Site: Back

## 2021-01-31 MED ORDER — MIDAZOLAM HCL 2 MG/2ML IJ SOLN
INTRAMUSCULAR | Status: AC
  Filled 2021-01-31: qty 2

## 2021-01-31 MED ORDER — POTASSIUM CHLORIDE IN NACL 20-0.9 MEQ/L-% IV SOLN
INTRAVENOUS | Status: DC
  Filled 2021-01-31 (×5): qty 1000

## 2021-01-31 MED ORDER — ROCURONIUM BROMIDE 10 MG/ML (PF) SYRINGE
PREFILLED_SYRINGE | INTRAVENOUS | Status: AC
  Filled 2021-01-31: qty 20

## 2021-01-31 MED ORDER — PHENYLEPHRINE HCL (PRESSORS) 10 MG/ML IV SOLN
INTRAVENOUS | Status: AC
  Filled 2021-01-31: qty 1

## 2021-01-31 MED ORDER — THROMBIN 20000 UNITS EX SOLR
CUTANEOUS | Status: DC | PRN
  Administered 2021-01-31: 20 mL via TOPICAL

## 2021-01-31 MED ORDER — METHOCARBAMOL 500 MG PO TABS: 500.0000 mg | ORAL_TABLET | Freq: Four times a day (QID) | ORAL | Status: DC | PRN

## 2021-01-31 MED ORDER — SUFENTANIL CITRATE 50 MCG/ML IV SOLN
INTRAVENOUS | Status: DC | PRN
  Administered 2021-01-31 (×2): 20 ug via INTRAVENOUS
  Administered 2021-01-31: 10 ug via INTRAVENOUS

## 2021-01-31 MED ORDER — FENTANYL CITRATE (PF) 250 MCG/5ML IJ SOLN
INTRAMUSCULAR | Status: AC
  Filled 2021-01-31: qty 5

## 2021-01-31 MED ORDER — VANCOMYCIN HCL 1000 MG IV SOLR
INTRAVENOUS | Status: DC | PRN
  Administered 2021-01-31: 2000 mg

## 2021-01-31 MED ORDER — SODIUM CHLORIDE 0.9 % IV SOLN
250.0000 mL | INTRAVENOUS | Status: DC
  Administered 2021-01-31: 250 mL via INTRAVENOUS

## 2021-01-31 MED ORDER — CEFAZOLIN SODIUM-DEXTROSE 2-3 GM-%(50ML) IV SOLR
INTRAVENOUS | Status: DC | PRN
  Administered 2021-01-31 (×2): 2 g via INTRAVENOUS

## 2021-01-31 MED ORDER — ROCURONIUM BROMIDE 10 MG/ML (PF) SYRINGE
PREFILLED_SYRINGE | INTRAVENOUS | Status: AC
  Filled 2021-01-31: qty 10

## 2021-01-31 MED ORDER — ACETAMINOPHEN 650 MG RE SUPP: 650.0000 mg | RECTAL | Status: DC | PRN

## 2021-01-31 MED ORDER — CHLORHEXIDINE GLUCONATE CLOTH 2 % EX PADS
6.0000 | MEDICATED_PAD | Freq: Every day | CUTANEOUS | Status: DC
  Administered 2021-02-01 – 2021-02-09 (×7): 6 via TOPICAL

## 2021-01-31 MED ORDER — BUPIVACAINE HCL (PF) 0.25 % IJ SOLN
INTRAMUSCULAR | Status: AC
  Filled 2021-01-31: qty 30

## 2021-01-31 MED ORDER — CEFAZOLIN SODIUM-DEXTROSE 2-4 GM/100ML-% IV SOLN
2.0000 g | Freq: Three times a day (TID) | INTRAVENOUS | Status: AC
  Administered 2021-01-31 – 2021-02-01 (×2): 2 g via INTRAVENOUS
  Filled 2021-01-31 (×2): qty 100

## 2021-01-31 MED ORDER — PROPOFOL 10 MG/ML IV BOLUS
INTRAVENOUS | Status: AC
  Filled 2021-01-31: qty 20

## 2021-01-31 MED ORDER — MIDAZOLAM HCL 2 MG/2ML IJ SOLN
INTRAMUSCULAR | Status: DC | PRN
  Administered 2021-01-31: 2 mg via INTRAVENOUS

## 2021-01-31 MED ORDER — VANCOMYCIN HCL 1000 MG IV SOLR
INTRAVENOUS | Status: AC
  Filled 2021-01-31: qty 40

## 2021-01-31 MED ORDER — BUPIVACAINE HCL (PF) 0.25 % IJ SOLN
INTRAMUSCULAR | Status: DC | PRN
  Administered 2021-01-31: 10 mL

## 2021-01-31 MED ORDER — THROMBIN 5000 UNITS EX SOLR
OROMUCOSAL | Status: DC | PRN
  Administered 2021-01-31: 5 mL via TOPICAL

## 2021-01-31 MED ORDER — 0.9 % SODIUM CHLORIDE (POUR BTL) OPTIME
TOPICAL | Status: DC | PRN
  Administered 2021-01-31: 3000 mL

## 2021-01-31 MED ORDER — MENTHOL 3 MG MT LOZG
1.0000 | LOZENGE | OROMUCOSAL | Status: DC | PRN
  Filled 2021-01-31: qty 9

## 2021-01-31 MED ORDER — ROCURONIUM 10MG/ML (10ML) SYRINGE FOR MEDFUSION PUMP - OPTIME
INTRAVENOUS | Status: DC | PRN
  Administered 2021-01-31: 50 mg via INTRAVENOUS
  Administered 2021-01-31 (×4): 100 mg via INTRAVENOUS

## 2021-01-31 MED ORDER — THROMBIN 5000 UNITS EX SOLR
CUTANEOUS | Status: AC
  Filled 2021-01-31: qty 5000

## 2021-01-31 MED ORDER — FENTANYL CITRATE (PF) 250 MCG/5ML IJ SOLN
INTRAMUSCULAR | Status: DC | PRN
  Administered 2021-01-31: 50 ug via INTRAVENOUS
  Administered 2021-01-31: 100 ug via INTRAVENOUS
  Administered 2021-01-31 (×2): 50 ug via INTRAVENOUS

## 2021-01-31 MED ORDER — CEFAZOLIN SODIUM 1 G IJ SOLR
INTRAMUSCULAR | Status: AC
  Filled 2021-01-31: qty 20

## 2021-01-31 MED ORDER — SUFENTANIL CITRATE 50 MCG/ML IV SOLN
INTRAVENOUS | Status: AC
  Filled 2021-01-31: qty 1

## 2021-01-31 MED ORDER — LACTATED RINGERS IV SOLN: INTRAVENOUS | Status: DC | PRN

## 2021-01-31 MED ORDER — PHENOL 1.4 % MT LIQD: 1.0000 | OROMUCOSAL | Status: DC | PRN

## 2021-01-31 MED ORDER — SODIUM CHLORIDE 0.9% FLUSH
3.0000 mL | Freq: Two times a day (BID) | INTRAVENOUS | Status: DC
  Administered 2021-01-31 – 2021-02-09 (×14): 3 mL via INTRAVENOUS

## 2021-01-31 MED ORDER — SODIUM CHLORIDE 0.9% FLUSH: 3.0000 mL | INTRAVENOUS | Status: DC | PRN

## 2021-01-31 MED ORDER — THROMBIN 20000 UNITS EX SOLR
CUTANEOUS | Status: AC
  Filled 2021-01-31: qty 20000

## 2021-01-31 MED ORDER — ALBUMIN HUMAN 5 % IV SOLN: INTRAVENOUS | Status: DC | PRN

## 2021-01-31 MED ORDER — ACETAMINOPHEN 325 MG PO TABS: 650.0000 mg | ORAL_TABLET | ORAL | Status: DC | PRN

## 2021-01-31 MED ORDER — METHOCARBAMOL 1000 MG/10ML IJ SOLN
500.0000 mg | Freq: Four times a day (QID) | INTRAVENOUS | Status: DC | PRN
  Filled 2021-01-31: qty 5

## 2021-01-31 SURGICAL SUPPLY — 72 items
ADH SKN CLS APL DERMABOND .7 (GAUZE/BANDAGES/DRESSINGS) ×1
APL SKNCLS STERI-STRIP NONHPOA (GAUZE/BANDAGES/DRESSINGS) ×1
BAG COUNTER SPONGE SURGICOUNT (BAG) ×2 IMPLANT
BAG SPNG CNTER NS LX DISP (BAG) ×1
BASKET BONE COLLECTION (BASKET) ×2 IMPLANT
BENZOIN TINCTURE PRP APPL 2/3 (GAUZE/BANDAGES/DRESSINGS) ×2 IMPLANT
BLADE CLIPPER SURG (BLADE) IMPLANT
BONE CANC CHIPS 40CC CAN1/2 (Bone Implant) ×4 IMPLANT
BONE MATRIX OSTEOCEL PRO LRG (Bone Implant) ×2 IMPLANT
BUR CARBIDE MATCH 3.0 (BURR) ×2 IMPLANT
CANISTER SUCT 3000ML PPV (MISCELLANEOUS) ×2 IMPLANT
CHIPS CANC BONE 40CC CAN1/2 (Bone Implant) ×2 IMPLANT
CLOSURE STERI-STRIP 1/4X4 (GAUZE/BANDAGES/DRESSINGS) ×1 IMPLANT
CNTNR URN SCR LID CUP LEK RST (MISCELLANEOUS) ×1 IMPLANT
CONT SPEC 4OZ STRL OR WHT (MISCELLANEOUS) ×2
COVER BACK TABLE 60X90IN (DRAPES) ×2 IMPLANT
DERMABOND ADVANCED (GAUZE/BANDAGES/DRESSINGS) ×1
DERMABOND ADVANCED .7 DNX12 (GAUZE/BANDAGES/DRESSINGS) ×1 IMPLANT
DIGITIZER BENDINI (MISCELLANEOUS) ×1 IMPLANT
DRAPE C-ARM 42X72 X-RAY (DRAPES) ×4 IMPLANT
DRAPE C-ARMOR (DRAPES) IMPLANT
DRAPE LAPAROTOMY 100X72X124 (DRAPES) ×2 IMPLANT
DRAPE SURG 17X23 STRL (DRAPES) ×2 IMPLANT
DRSG OPSITE POSTOP 4X10 (GAUZE/BANDAGES/DRESSINGS) ×2 IMPLANT
DRSG TELFA 3X8 NADH (GAUZE/BANDAGES/DRESSINGS) ×2 IMPLANT
DURAPREP 26ML APPLICATOR (WOUND CARE) ×2 IMPLANT
ELECT REM PT RETURN 9FT ADLT (ELECTROSURGICAL) ×2
ELECTRODE REM PT RTRN 9FT ADLT (ELECTROSURGICAL) ×1 IMPLANT
EVACUATOR 1/8 PVC DRAIN (DRAIN) ×2 IMPLANT
GAUZE 4X4 16PLY ~~LOC~~+RFID DBL (SPONGE) IMPLANT
GLOVE SURG ENC MOIS LTX SZ7 (GLOVE) IMPLANT
GLOVE SURG ENC MOIS LTX SZ8 (GLOVE) ×4 IMPLANT
GLOVE SURG UNDER POLY LF SZ7 (GLOVE) IMPLANT
GOWN STRL REUS W/ TWL LRG LVL3 (GOWN DISPOSABLE) IMPLANT
GOWN STRL REUS W/ TWL XL LVL3 (GOWN DISPOSABLE) ×2 IMPLANT
GOWN STRL REUS W/TWL 2XL LVL3 (GOWN DISPOSABLE) IMPLANT
GOWN STRL REUS W/TWL LRG LVL3 (GOWN DISPOSABLE)
GOWN STRL REUS W/TWL XL LVL3 (GOWN DISPOSABLE) ×4
GRAFT BNE CHIP CANC 1-8 40 (Bone Implant) IMPLANT
HEMOSTAT POWDER KIT SURGIFOAM (HEMOSTASIS) IMPLANT
KIT BASIN OR (CUSTOM PROCEDURE TRAY) ×2 IMPLANT
KIT GRAFTMAG DEL NEURO DISP (NEUROSURGERY SUPPLIES) IMPLANT
KIT TURNOVER KIT B (KITS) ×2 IMPLANT
MILL MEDIUM DISP (BLADE) ×2 IMPLANT
NDL HYPO 25X1 1.5 SAFETY (NEEDLE) ×1 IMPLANT
NEEDLE HYPO 25X1 1.5 SAFETY (NEEDLE) ×2 IMPLANT
NS IRRIG 1000ML POUR BTL (IV SOLUTION) ×4 IMPLANT
PACK LAMINECTOMY NEURO (CUSTOM PROCEDURE TRAY) ×2 IMPLANT
PAD ARMBOARD 7.5X6 YLW CONV (MISCELLANEOUS) ×6 IMPLANT
PAD DRESSING TELFA 3X8 NADH (GAUZE/BANDAGES/DRESSINGS) IMPLANT
ROD RELINE MAS COCR 6X500 (Rod) ×2 IMPLANT
SCREW CANC PA 6.5X40 (Screw) ×2 IMPLANT
SCREW CANC PA 6.5X45 (Screw) ×2 IMPLANT
SCREW CORT-CANC 5.5X45 (Screw) ×2 IMPLANT
SCREW KODIAK 5.5X45 (Screw) ×9 IMPLANT
SCREW KODIAK 6.5X45 (Screw) ×2 IMPLANT
SCREW POLY 4.5X45 (Screw) ×2 IMPLANT
SET SCREW (Screw) ×38 IMPLANT
SET SCREW SPNE (Screw) IMPLANT
SPONGE SURGIFOAM ABS GEL 100 (HEMOSTASIS) ×2 IMPLANT
SPONGE T-LAP 4X18 ~~LOC~~+RFID (SPONGE) IMPLANT
STRIP CLOSURE SKIN 1/2X4 (GAUZE/BANDAGES/DRESSINGS) ×5 IMPLANT
SUT PROLENE 6 0 BV (SUTURE) ×2 IMPLANT
SUT VIC AB 0 CT1 18XCR BRD8 (SUTURE) ×1 IMPLANT
SUT VIC AB 0 CT1 8-18 (SUTURE) ×8
SUT VIC AB 2-0 CP2 18 (SUTURE) ×4 IMPLANT
SUT VIC AB 3-0 SH 8-18 (SUTURE) ×8 IMPLANT
SYR CONTROL 10ML LL (SYRINGE) ×2 IMPLANT
TOWEL GREEN STERILE (TOWEL DISPOSABLE) ×2 IMPLANT
TOWEL GREEN STERILE FF (TOWEL DISPOSABLE) ×2 IMPLANT
TRAY FOLEY MTR SLVR 16FR STAT (SET/KITS/TRAYS/PACK) ×2 IMPLANT
WATER STERILE IRR 1000ML POUR (IV SOLUTION) ×2 IMPLANT

## 2021-01-31 NOTE — Transfer of Care (Signed)
Immediate Anesthesia Transfer of Care Note  Patient: Jesus Spencer  Procedure(s) Performed: Open Reduction Internal Fixation Thoracic twelve FRACTURE SCREWS TO Thoracic six-lumbar three (Back)  Patient Location: SICU  Anesthesia Type:General  Level of Consciousness: sedated and Patient remains intubated per anesthesia plan  Airway & Oxygen Therapy: Patient remains intubated per anesthesia plan and Patient placed on Ventilator (see vital sign flow sheet for setting)  Post-op Assessment: Report given to RN and Post -op Vital signs reviewed and stable  Post vital signs: Reviewed and stable  Last Vitals:  Vitals Value Taken Time  BP 121/77 01/31/21 1611  Temp    Pulse 103 01/31/21 1617  Resp 15 01/31/21 1617  SpO2 96 % 01/31/21 1617  Vitals shown include unvalidated device data.  Last Pain:  Vitals:   01/31/21 0800  TempSrc: Axillary  PainSc:          Complications: No notable events documented.

## 2021-01-31 NOTE — Progress Notes (Signed)
Patient remains intubated and sedated.  He is on the schedule for T6-L3 instrumented fusion with open reduction internal fixation of T12 fracture.  I have spoken with the family this morning.  Have tried to answer their questions.  They understand the risks of the surgery include but are not limited to bleeding, infection, CSF leak, nerve injury, spinal cord injury, numbness, weakness, paralysis, loss of bowel bladder or sexual function, pseudoarthrosis, misplaced instrumentation, need for further surgery, lack of relief of symptoms, worsening symptoms, vascular injury, lung injury, and anesthesia risk including DVT pneumonia MI and death.  They agree to proceed.

## 2021-01-31 NOTE — Anesthesia Procedure Notes (Signed)
Arterial Line Insertion Start/End11/30/2022 10:17 AM, 01/31/2021 10:22 AM Performed by: [name], MD  Patient location: Pre-op. Preanesthetic checklist: patient identified, IV checked, site marked, risks and benefits discussed, surgical consent, monitors and equipment checked, pre-op evaluation, timeout performed and anesthesia consent Lidocaine 1% used for infiltration Left, radial was placed Catheter size: 20 G Hand hygiene performed  and maximum sterile barriers used   Attempts: 1 Procedure performed without using ultrasound guided technique. Following insertion, dressing applied and Biopatch. Post procedure assessment: normal and unchanged  Patient tolerated the procedure well with no immediate complications.
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
## 2021-01-31 NOTE — Anesthesia Preprocedure Evaluation (Addendum)
Anesthesia Evaluation  Patient identified by MRN, date of birth, ID band  Reviewed: Allergy & Precautions, NPO status , Patient's Chart, lab work & pertinent test results  Airway Mallampati: Intubated       Dental   Pulmonary neg pulmonary ROS,    Pulmonary exam normal breath sounds clear to auscultation       Cardiovascular negative cardio ROS Normal cardiovascular exam Rhythm:Regular Rate:Normal     Neuro/Psych negative neurological ROS     GI/Hepatic negative GI ROS, Neg liver ROS,   Endo/Other  negative endocrine ROS  Renal/GU negative Renal ROS     Musculoskeletal negative musculoskeletal ROS (+)   Abdominal   Peds  Hematology negative hematology ROS (+)   Anesthesia Other Findings   Reproductive/Obstetrics                            Anesthesia Physical Anesthesia Plan  ASA: 2  Anesthesia Plan: General   Post-op Pain Management:    Induction: Intravenous  PONV Risk Score and Plan:   Airway Management Planned: Oral ETT  Additional Equipment: Arterial line  Intra-op Plan:   Post-operative Plan: Post-operative intubation/ventilation  Informed Consent: I have reviewed the patients History and Physical, chart, labs and discussed the procedure including the risks, benefits and alternatives for the proposed anesthesia with the patient or authorized representative who has indicated his/her understanding and acceptance.     Dental advisory given  Plan Discussed with: CRNA  Anesthesia Plan Comments: (2 x PIV  New/worsening left pleural effusion noted on CXR this am. Discussed with Dr. Bedelia Person. She states he clinically meets extubation criteria, but would place a chest tube in the OR were it to become necessary. I discussed this with Dr. Yetta Barre as well. )      Anesthesia Quick Evaluation

## 2021-01-31 NOTE — Progress Notes (Signed)
Orthopedic Tech Progress Note Patient Details:  Jesus Spencer 1995/10/06 [id number]  Ortho Devices Type of Ortho Device: Thoracolumbar corset (TLSO) Ortho Device/Splint Location: BACK Ortho Device/Splint Interventions: Ordered   Post Interventions Patient Tolerated: Well Instructions Provided: Care of device  [name] 01/31/2021, 5:55 PM

## 2021-02-01 LAB — GLUCOSE, CAPILLARY
Glucose-Capillary: 108 mg/dL — ABNORMAL HIGH (ref 70–99)
Glucose-Capillary: 110 mg/dL — ABNORMAL HIGH (ref 70–99)
Glucose-Capillary: 116 mg/dL — ABNORMAL HIGH (ref 70–99)
Glucose-Capillary: 116 mg/dL — ABNORMAL HIGH (ref 70–99)
Glucose-Capillary: 120 mg/dL — ABNORMAL HIGH (ref 70–99)
Glucose-Capillary: 142 mg/dL — ABNORMAL HIGH (ref 70–99)

## 2021-02-01 LAB — TRIGLYCERIDES: Triglycerides: 193 mg/dL — ABNORMAL HIGH (ref <150)

## 2021-02-01 LAB — CBC
HCT: 28 % — ABNORMAL LOW (ref 39.0–52.0)
Hemoglobin: 9.5 g/dL — ABNORMAL LOW (ref 13.0–17.0)
MCH: 30.2 pg (ref 26.0–34.0)
MCHC: 33.9 g/dL (ref 30.0–36.0)
MCV: 88.9 fL (ref 80.0–100.0)
Platelets: 212 10*3/uL (ref 150–400)
RBC: 3.15 MIL/uL — ABNORMAL LOW (ref 4.22–5.81)
RDW: 13.2 % (ref 11.5–15.5)
WBC: 9.3 10*3/uL (ref 4.0–10.5)
nRBC: 0 % (ref 0.0–0.2)

## 2021-02-01 LAB — BASIC METABOLIC PANEL
Anion gap: 4 — ABNORMAL LOW (ref 5–15)
BUN: 9 mg/dL (ref 6–20)
CO2: 29 mmol/L (ref 22–32)
Calcium: 7.7 mg/dL — ABNORMAL LOW (ref 8.9–10.3)
Chloride: 105 mmol/L (ref 98–111)
Creatinine, Ser: 0.52 mg/dL — ABNORMAL LOW (ref 0.61–1.24)
GFR, Estimated: >60 mL/min (ref >60)
Glucose, Bld: 119 mg/dL — ABNORMAL HIGH (ref 70–99)
Potassium: 3.5 mmol/L (ref 3.5–5.1)
Sodium: 138 mmol/L (ref 135–145)

## 2021-02-01 MED ORDER — CLONAZEPAM 0.5 MG PO TABS
0.5000 mg | ORAL_TABLET | Freq: Two times a day (BID) | ORAL | Status: DC
  Administered 2021-02-01 (×2): 0.5 mg
  Filled 2021-02-01 (×2): qty 1

## 2021-02-01 MED ORDER — ACETAMINOPHEN 500 MG PO TABS
1000.0000 mg | ORAL_TABLET | Freq: Four times a day (QID) | ORAL | Status: DC
  Administered 2021-02-01 – 2021-02-02 (×3): 1000 mg
  Filled 2021-02-01 (×3): qty 2

## 2021-02-01 MED ORDER — GUAIFENESIN 100 MG/5ML PO LIQD
10.0000 mL | Freq: Four times a day (QID) | ORAL | Status: DC
Start: 2021-02-01 — End: 2021-02-02
  Administered 2021-02-01 – 2021-02-02 (×4): 10 mL
  Filled 2021-02-01 (×4): qty 15

## 2021-02-01 MED ORDER — QUETIAPINE FUMARATE 25 MG PO TABS
25.0000 mg | ORAL_TABLET | Freq: Two times a day (BID) | ORAL | Status: DC
  Administered 2021-02-01 (×2): 25 mg
  Filled 2021-02-01 (×3): qty 1
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
## 2021-02-01 NOTE — Anesthesia Postprocedure Evaluation (Signed)
Anesthesia Post Note  Patient: Jesus Spencer  Procedure(s) Performed: Open Reduction Internal Fixation Thoracic twelve FRACTURE SCREWS TO Thoracic six-lumbar three (Back)     Patient location during evaluation: SICU Anesthesia Type: General Level of consciousness: sedated and patient remains intubated per anesthesia plan Pain management: pain level controlled Vital Signs Assessment: post-procedure vital signs reviewed and stable Respiratory status: patient remains intubated per anesthesia plan Cardiovascular status: stable Anesthetic complications: no   No notable events documented.  Last Vitals:  Vitals:   02/01/21 1835 02/01/21 1900  BP:  119/69  Pulse: (!) 117 (!) 111  Resp: 17 16  Temp:    SpO2: 95% 96%    Last Pain:  Vitals:   02/01/21 1600  TempSrc: Axillary  PainSc:                  Jesus Spencer

## 2021-02-01 NOTE — Progress Notes (Signed)
Subjective: Patient reports moderate back pain but resting comfortably, no pain in his legs. Some pain in his abdomen  Objective: Vital signs in last 24 hours: Temp:  [99.1 F (37.3 C)-100.4 F (38 C)] 100.4 F (38 C) (12/01 0400) Pulse Rate:  [98-120] 115 (12/01 0600) Resp:  [14-22] 14 (12/01 0600) BP: (100-166)/(50-83) 139/80 (12/01 0600) SpO2:  [91 %-99 %] 96 % (12/01 0600) FiO2 (%):  [40 %-100 %] 50 % (12/01 0306) Weight:  [82.9 kg] 82.9 kg (12/01 0500)  Intake/Output from previous day: 11/30 0701 - 12/01 0700 In: 4356.6 [I.V.:3482.6; NG/GT:424; IV Piggyback:450.1] Out: 2395 [Urine:1705; Blood:300; Chest Tube:280] Intake/Output this shift: No intake/output data recorded.  Neurologic: Grossly normal  Lab Results: Lab Results  Component Value Date   WBC 9.3 02/01/2021   HGB 9.5 (L) 02/01/2021   HCT 28.0 (L) 02/01/2021   MCV 88.9 02/01/2021   PLT 212 02/01/2021   Lab Results  Component Value Date   INR 1.1 01/27/2021   BMET Lab Results  Component Value Date   NA 138 02/01/2021   K 3.5 02/01/2021   CL 105 02/01/2021   CO2 29 02/01/2021   GLUCOSE 119 (H) 02/01/2021   BUN 9 02/01/2021   CREATININE 0.52 (L) 02/01/2021   CALCIUM 7.7 (L) 02/01/2021    Studies/Results: DG THORACOLUMABAR SPINE  Result Date: 01/31/2021 CLINICAL DATA:  Posterior spinal fusion for thoracic spine fracture EXAM: THORACOLUMBAR SPINE 1V COMPARISON:  Thoracic spine MRI 01/28/2021 FINDINGS: Eight C-arm fluoroscopic images were obtained intraoperatively and submitted for post operative interpretation. Postsurgical changes reflecting posterior spinal fixation extending from the thoracic spine into the upper lumbar spine are noted. Given limited field of view, the specific levels involved can not be confidently determine. Hardware alignment is within expected limits, without evidence of hardware related complication. There is mild compression deformity of the T12 vertebral body. Fluoro time 2  minutes 59 seconds. Please see the performing provider's procedural report for further detail. IMPRESSION: Postsurgical changes reflecting posterior spinal fusion extending from the thoracic spine into the lumbar spine across the fractured T12 vertebral body. The exact levels involved can not be readily determined given limited field of view, but there is no evidence of hardware related complication. Please see the operative report for further detail. Electronically Signed   By: Lesia Hausen M.D.   On: 01/31/2021 15:45   DG CHEST PORT 1 VIEW  Result Date: 01/31/2021 CLINICAL DATA:  Pneumothorax.  Right chest tube. EXAM: PORTABLE CHEST 1 VIEW COMPARISON:  01/30/2021 FINDINGS: Endotracheal tube tip is 4 cm above the carina. Orogastric or nasogastric tube enters the stomach. Right chest tube remains in place. No visible pneumothorax. Mild pulmonary contusion on the right. Persistent left lower lobe collapse. There may be increasing pleural fluid on the left. No visible pneumothorax on the right. Right clavicle fracture as seen previously. IMPRESSION: Lines and tubes well positioned. No visible pneumothorax. Similar appearance of right lung contusion. Left lower lobe collapse, possibly with increasing pleural fluid on the left. Electronically Signed   By: Paulina Fusi M.D.   On: 01/31/2021 08:24   DG C-Arm 1-60 Min-No Report  Result Date: 01/31/2021 Fluoroscopy was utilized by the requesting physician.  No radiographic interpretation.   DG C-Arm 1-60 Min-No Report  Result Date: 01/31/2021 Fluoroscopy was utilized by the requesting physician.  No radiographic interpretation.   DG C-Arm 1-60 Min-No Report  Result Date: 01/31/2021 Fluoroscopy was utilized by the requesting physician.  No radiographic interpretation.   DG C-Arm  1-60 Min-No Report  Result Date: 01/31/2021 Fluoroscopy was utilized by the requesting physician.  No radiographic interpretation.   DG C-Arm 1-60 Min-No Report  Result  Date: 01/31/2021 Fluoroscopy was utilized by the requesting physician.  No radiographic interpretation.    Assessment/Plan: Postop day 1 multilevel thoracolumbar fusion. Doing well from nrsgy standpoint. Continue supportive care per trauma   LOS: 4 days    Tiana Loft [facility] 02/01/2021, 7:48 AM

## 2021-02-01 NOTE — Progress Notes (Signed)
Pt log rolled to change dressing and sheet. Dressing saturated with fluid from drain removal. Dressing to back changed and reinforced. Pt tolerated fairly, pain with log rolling noted. WCTM.

## 2021-02-02 ENCOUNTER — Inpatient Hospital Stay (HOSPITAL_COMMUNITY): Payer: No Typology Code available for payment source

## 2021-02-02 LAB — BASIC METABOLIC PANEL
Anion gap: 6 (ref 5–15)
BUN: 8 mg/dL (ref 6–20)
CO2: 26 mmol/L (ref 22–32)
Calcium: 7.8 mg/dL — ABNORMAL LOW (ref 8.9–10.3)
Chloride: 104 mmol/L (ref 98–111)
Creatinine, Ser: 0.47 mg/dL — ABNORMAL LOW (ref 0.61–1.24)
GFR, Estimated: >60 mL/min (ref >60)
Glucose, Bld: 116 mg/dL — ABNORMAL HIGH (ref 70–99)
Potassium: 3.6 mmol/L (ref 3.5–5.1)
Sodium: 136 mmol/L (ref 135–145)

## 2021-02-02 LAB — CBC
HCT: 28.2 % — ABNORMAL LOW (ref 39.0–52.0)
Hemoglobin: 9.3 g/dL — ABNORMAL LOW (ref 13.0–17.0)
MCH: 29.7 pg (ref 26.0–34.0)
MCHC: 33 g/dL (ref 30.0–36.0)
MCV: 90.1 fL (ref 80.0–100.0)
Platelets: 277 10*3/uL (ref 150–400)
RBC: 3.13 MIL/uL — ABNORMAL LOW (ref 4.22–5.81)
RDW: 13.4 % (ref 11.5–15.5)
WBC: 12.3 10*3/uL — ABNORMAL HIGH (ref 4.0–10.5)
nRBC: 0 % (ref 0.0–0.2)

## 2021-02-02 LAB — GLUCOSE, CAPILLARY
Glucose-Capillary: 127 mg/dL — ABNORMAL HIGH (ref 70–99)
Glucose-Capillary: 124 mg/dL — ABNORMAL HIGH (ref 70–99)
Glucose-Capillary: 99 mg/dL (ref 70–99)
Glucose-Capillary: 103 mg/dL — ABNORMAL HIGH (ref 70–99)

## 2021-02-02 MED ORDER — GUAIFENESIN 100 MG/5ML PO LIQD: 10.0000 mL | Freq: Four times a day (QID) | ORAL | Status: DC

## 2021-02-02 MED ORDER — PREGABALIN 25 MG PO CAPS
50.0000 mg | ORAL_CAPSULE | Freq: Two times a day (BID) | ORAL | Status: DC
  Administered 2021-02-02 – 2021-02-04 (×6): 50 mg via ORAL
  Filled 2021-02-02: qty 1
  Filled 2021-02-02: qty 2
  Filled 2021-02-02 (×4): qty 1

## 2021-02-02 MED ORDER — HYDROMORPHONE HCL 1 MG/ML IJ SOLN
1.0000 mg | Freq: Once | INTRAMUSCULAR | Status: AC
  Administered 2021-02-02: 1 mg via INTRAVENOUS
  Filled 2021-02-02: qty 1

## 2021-02-02 MED ORDER — CLONAZEPAM 0.25 MG PO TBDP
0.2500 mg | ORAL_TABLET | Freq: Two times a day (BID) | ORAL | Status: AC
  Administered 2021-02-02 – 2021-02-03 (×3): 0.25 mg via ORAL
  Filled 2021-02-02 (×2): qty 1

## 2021-02-02 MED ORDER — CLONAZEPAM 0.25 MG PO TBDP
0.2500 mg | ORAL_TABLET | Freq: Two times a day (BID) | ORAL | Status: DC
  Filled 2021-02-02: qty 1

## 2021-02-02 MED ORDER — BISACODYL 10 MG RE SUPP
10.0000 mg | Freq: Every day | RECTAL | Status: DC
  Administered 2021-02-05 – 2021-02-09 (×2): 10 mg via RECTAL
  Filled 2021-02-02 (×4): qty 1

## 2021-02-02 MED ORDER — DOCUSATE SODIUM 50 MG/5ML PO LIQD: 100.0000 mg | Freq: Two times a day (BID) | ORAL | Status: DC

## 2021-02-02 MED ORDER — QUETIAPINE FUMARATE 50 MG PO TABS
25.0000 mg | ORAL_TABLET | Freq: Two times a day (BID) | ORAL | Status: DC
  Administered 2021-02-02 – 2021-02-08 (×12): 25 mg via ORAL
  Filled 2021-02-02 (×13): qty 1

## 2021-02-02 MED ORDER — POLYETHYLENE GLYCOL 3350 17 G PO PACK
17.0000 g | PACK | Freq: Two times a day (BID) | ORAL | Status: DC
  Filled 2021-02-02: qty 1

## 2021-02-02 MED ORDER — ACETAMINOPHEN 10 MG/ML IV SOLN
1000.0000 mg | Freq: Four times a day (QID) | INTRAVENOUS | Status: AC
  Administered 2021-02-02 – 2021-02-03 (×3): 1000 mg via INTRAVENOUS
  Filled 2021-02-02 (×4): qty 100

## 2021-02-02 MED ORDER — METHOCARBAMOL 500 MG PO TABS: 1000.0000 mg | ORAL_TABLET | Freq: Three times a day (TID) | ORAL | Status: DC

## 2021-02-02 MED ORDER — POLYETHYLENE GLYCOL 3350 17 G PO PACK
17.0000 g | PACK | Freq: Two times a day (BID) | ORAL | Status: DC
  Administered 2021-02-05 – 2021-02-09 (×7): 17 g via ORAL
  Filled 2021-02-02 (×12): qty 1

## 2021-02-02 MED ORDER — HYDROMORPHONE HCL 1 MG/ML IJ SOLN
0.5000 mg | INTRAMUSCULAR | Status: DC | PRN
Start: 2021-02-02 — End: 2021-02-05
  Administered 2021-02-03 – 2021-02-04 (×8): 1 mg via INTRAVENOUS
  Filled 2021-02-02 (×8): qty 1

## 2021-02-02 MED ORDER — KETOROLAC TROMETHAMINE 15 MG/ML IJ SOLN
30.0000 mg | Freq: Four times a day (QID) | INTRAMUSCULAR | Status: AC
  Administered 2021-02-02 – 2021-02-07 (×20): 30 mg via INTRAVENOUS
  Filled 2021-02-02 (×20): qty 2

## 2021-02-02 MED ORDER — HYDROMORPHONE HCL 1 MG/ML IJ SOLN
1.0000 mg | INTRAMUSCULAR | Status: AC | PRN
  Administered 2021-02-02 (×4): 1 mg via INTRAVENOUS
  Filled 2021-02-02 (×4): qty 1

## 2021-02-02 MED ORDER — METHOCARBAMOL 1000 MG/10ML IJ SOLN
1000.0000 mg | Freq: Three times a day (TID) | INTRAVENOUS | Status: DC
  Administered 2021-02-02 – 2021-02-04 (×6): 1000 mg via INTRAVENOUS
  Filled 2021-02-02: qty 1000
  Filled 2021-02-02 (×6): qty 10
  Filled 2021-02-02: qty 1000
  Filled 2021-02-02: qty 10
  Filled 2021-02-02 (×2): qty 1000
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
## 2021-02-02 NOTE — Procedures (Signed)
Extubation Procedure Note  Patient Details:   Name: Jesus Spencer DOB: June 02, 1995 MRN: [id number]   Airway Documentation:    Vent end date: 02/02/21 Vent end time: 0908   Evaluation  O2 sats: stable throughout Complications: No apparent complications Patient did tolerate procedure well. Bilateral Breath Sounds: Rhonchi   Yes  Pt extubated to 3L [name] per MD order. Positive cuff leak noted, no stridor heard. Vitals stable throughout. RT will continue to monitor.   Memory Argue 02/02/2021, 9:11 AM
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
## 2021-02-02 NOTE — Progress Notes (Signed)
Subjective: Patient reports moderate back pain but doing wel  Objective: Vital signs in last 24 hours: Temp:  [99.4 F (37.4 C)-100.3 F (37.9 C)] 99.5 F (37.5 C) (12/02 0800) Pulse Rate:  [106-122] 110 (12/02 0910) Resp:  [9-22] 22 (12/02 0910) BP: (109-158)/(62-76) 158/72 (12/02 0752) SpO2:  [93 %-98 %] 94 % (12/02 0910) FiO2 (%):  [40 %] 40 % (12/02 0752)  Intake/Output from previous day: 12/01 0701 - 12/02 0700 In: 3716.2 [I.V.:2796.2; NG/GT:920] Out: 1905 [Urine:1675; Chest Tube:230] Intake/Output this shift: No intake/output data recorded.  Neurologic: Grossly normal  Lab Results: Lab Results  Component Value Date   WBC 12.3 (H) 02/02/2021   HGB 9.3 (L) 02/02/2021   HCT 28.2 (L) 02/02/2021   MCV 90.1 02/02/2021   PLT 277 02/02/2021   Lab Results  Component Value Date   INR 1.1 01/27/2021   BMET Lab Results  Component Value Date   NA 136 02/02/2021   K 3.6 02/02/2021   CL 104 02/02/2021   CO2 26 02/02/2021   GLUCOSE 116 (H) 02/02/2021   BUN 8 02/02/2021   CREATININE 0.47 (L) 02/02/2021   CALCIUM 7.8 (L) 02/02/2021    Studies/Results: DG THORACOLUMABAR SPINE  Result Date: 01/31/2021 CLINICAL DATA:  Posterior spinal fusion for thoracic spine fracture EXAM: THORACOLUMBAR SPINE 1V COMPARISON:  Thoracic spine MRI 01/28/2021 FINDINGS: Eight C-arm fluoroscopic images were obtained intraoperatively and submitted for post operative interpretation. Postsurgical changes reflecting posterior spinal fixation extending from the thoracic spine into the upper lumbar spine are noted. Given limited field of view, the specific levels involved can not be confidently determine. Hardware alignment is within expected limits, without evidence of hardware related complication. There is mild compression deformity of the T12 vertebral body. Fluoro time 2 minutes 59 seconds. Please see the performing provider's procedural report for further detail. IMPRESSION: Postsurgical changes  reflecting posterior spinal fusion extending from the thoracic spine into the lumbar spine across the fractured T12 vertebral body. The exact levels involved can not be readily determined given limited field of view, but there is no evidence of hardware related complication. Please see the operative report for further detail. Electronically Signed   By: Lesia Hausen M.D.   On: 01/31/2021 15:45   DG CHEST PORT 1 VIEW  Result Date: 02/02/2021 CLINICAL DATA:  25 year old male with history of right-sided pneumothorax. Follow-up study. EXAM: PORTABLE CHEST 1 VIEW COMPARISON:  Chest CT 01/31/2021. FINDINGS: An endotracheal tube is in place with tip 4.7 cm above the carina. A nasogastric tube is seen extending into the stomach, however, the tip of the nasogastric tube extends below the lower margin of the image. Right-sided small bore chest tube with pigtail reformed over the lower aspect of the right hemithorax. Lung volumes are low. Bibasilar opacities (left-greater-than-right) may reflect areas of atelectasis and/or consolidation. Small left pleural effusion. No right pleural effusion. No appreciable pneumothorax. No evidence of pulmonary edema. Heart size is normal. Upper mediastinal contours are within normal limits. New rod and screw fixation hardware in the lower thoracic and upper lumbar spine incompletely imaged. IMPRESSION: 1. Support apparatus, as above. 2. Low lung volumes with bibasilar areas of atelectasis and/or consolidation, and small left pleural effusion. 3. Improving aeration in the right upper lobe when compared to the prior examination, suggesting resolving pneumonia. Electronically Signed   By: Trudie Reed M.D.   On: 02/02/2021 08:15   DG C-Arm 1-60 Min-No Report  Result Date: 01/31/2021 Fluoroscopy was utilized by the requesting physician.  No  radiographic interpretation.   DG C-Arm 1-60 Min-No Report  Result Date: 01/31/2021 Fluoroscopy was utilized by the requesting physician.   No radiographic interpretation.   DG C-Arm 1-60 Min-No Report  Result Date: 01/31/2021 Fluoroscopy was utilized by the requesting physician.  No radiographic interpretation.   DG C-Arm 1-60 Min-No Report  Result Date: 01/31/2021 Fluoroscopy was utilized by the requesting physician.  No radiographic interpretation.   DG C-Arm 1-60 Min-No Report  Result Date: 01/31/2021 Fluoroscopy was utilized by the requesting physician.  No radiographic interpretation.    Assessment/Plan: Postop day 2 thoracolumbar fusion, extubated today and doing well. Ok to get patient out of bed with brace. Continue pain management today    LOS: 5 days    [facility] 02/02/2021, 9:40 AM

## 2021-02-03 MED ORDER — ACETAMINOPHEN 500 MG PO TABS
1000.0000 mg | ORAL_TABLET | Freq: Three times a day (TID) | ORAL | Status: DC
Start: 2021-02-03 — End: 2021-02-05
  Administered 2021-02-03 – 2021-02-04 (×5): 1000 mg via ORAL
  Filled 2021-02-03 (×6): qty 2

## 2021-02-03 MED ORDER — ENOXAPARIN SODIUM 30 MG/0.3ML IJ SOSY
30.0000 mg | PREFILLED_SYRINGE | Freq: Two times a day (BID) | INTRAMUSCULAR | Status: DC
  Administered 2021-02-03 – 2021-02-10 (×14): 30 mg via SUBCUTANEOUS
  Filled 2021-02-03 (×14): qty 0.3

## 2021-02-03 MED ORDER — DOCUSATE SODIUM 100 MG PO CAPS
100.0000 mg | ORAL_CAPSULE | Freq: Two times a day (BID) | ORAL | Status: DC
  Administered 2021-02-03 – 2021-02-10 (×11): 100 mg via ORAL
  Filled 2021-02-03 (×14): qty 1

## 2021-02-03 MED ORDER — OXYCODONE HCL 5 MG PO TABS
5.0000 mg | ORAL_TABLET | ORAL | Status: DC | PRN
  Administered 2021-02-03 – 2021-02-05 (×11): 10 mg via ORAL
  Filled 2021-02-03 (×11): qty 2

## 2021-02-03 MED ORDER — TAMSULOSIN HCL 0.4 MG PO CAPS
0.4000 mg | ORAL_CAPSULE | Freq: Every day | ORAL | Status: DC
  Administered 2021-02-03 – 2021-02-08 (×5): 0.4 mg via ORAL
  Filled 2021-02-03 (×7): qty 1

## 2021-02-03 MED ORDER — GUAIFENESIN 100 MG/5ML PO LIQD
10.0000 mL | Freq: Four times a day (QID) | ORAL | Status: DC | PRN
  Filled 2021-02-03: qty 10
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
## 2021-02-03 NOTE — Plan of Care (Signed)
  Problem: Nutrition: Goal: Adequate nutrition will be maintained Outcome: Progressing   Problem: Coping: Goal: Level of anxiety will decrease Outcome: Progressing   Problem: Pain Managment: Goal: General experience of comfort will improve Outcome: Progressing   
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
## 2021-02-03 NOTE — Progress Notes (Signed)
Foley catheter reinserted per nurse driven urinary retention protocol. Patient in and out catheterized per protocol 12/3 at 0000, 0600, and 1200 prior to reinserting Foley. Tamsulosin for retention already on board. Pt. and family education provided prior to reinserting foley. Pt. tolerated Foley insertion well.

## 2021-02-04 ENCOUNTER — Inpatient Hospital Stay (HOSPITAL_COMMUNITY): Payer: No Typology Code available for payment source

## 2021-02-04 LAB — BASIC METABOLIC PANEL
Anion gap: 8 (ref 5–15)
BUN: 12 mg/dL (ref 6–20)
CO2: 25 mmol/L (ref 22–32)
Calcium: 8.2 mg/dL — ABNORMAL LOW (ref 8.9–10.3)
Chloride: 104 mmol/L (ref 98–111)
Creatinine, Ser: 0.52 mg/dL — ABNORMAL LOW (ref 0.61–1.24)
GFR, Estimated: >60 mL/min (ref >60)
Glucose, Bld: 96 mg/dL (ref 70–99)
Potassium: 3.6 mmol/L (ref 3.5–5.1)
Sodium: 137 mmol/L (ref 135–145)

## 2021-02-04 LAB — HEPATIC FUNCTION PANEL
ALT: 72 U/L — ABNORMAL HIGH (ref 0–44)
AST: 51 U/L — ABNORMAL HIGH (ref 15–41)
Albumin: 1.9 g/dL — ABNORMAL LOW (ref 3.5–5.0)
Alkaline Phosphatase: 106 U/L (ref 38–126)
Bilirubin, Direct: <0.1 mg/dL (ref 0.0–0.2)
Total Bilirubin: 0.6 mg/dL (ref 0.3–1.2)
Total Protein: 5.2 g/dL — ABNORMAL LOW (ref 6.5–8.1)

## 2021-02-04 LAB — CBC
HCT: 27.9 % — ABNORMAL LOW (ref 39.0–52.0)
Hemoglobin: 9.5 g/dL — ABNORMAL LOW (ref 13.0–17.0)
MCH: 30 pg (ref 26.0–34.0)
MCHC: 34.1 g/dL (ref 30.0–36.0)
MCV: 88 fL (ref 80.0–100.0)
Platelets: 538 10*3/uL — ABNORMAL HIGH (ref 150–400)
RBC: 3.17 MIL/uL — ABNORMAL LOW (ref 4.22–5.81)
RDW: 12.9 % (ref 11.5–15.5)
WBC: 10.8 10*3/uL — ABNORMAL HIGH (ref 4.0–10.5)
nRBC: 0.2 % (ref 0.0–0.2)

## 2021-02-04 MED ORDER — METHOCARBAMOL 500 MG PO TABS
1000.0000 mg | ORAL_TABLET | Freq: Three times a day (TID) | ORAL | Status: DC
  Administered 2021-02-04 – 2021-02-10 (×17): 1000 mg via ORAL
  Filled 2021-02-04 (×19): qty 2
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
## 2021-02-05 MED ORDER — OXYCODONE HCL 5 MG PO TABS
10.0000 mg | ORAL_TABLET | ORAL | Status: DC | PRN
  Administered 2021-02-05: 10 mg via ORAL
  Administered 2021-02-06 – 2021-02-08 (×9): 15 mg via ORAL
  Filled 2021-02-05 (×6): qty 3
  Filled 2021-02-05: qty 2
  Filled 2021-02-05 (×3): qty 3

## 2021-02-05 MED ORDER — ACETAMINOPHEN 500 MG PO TABS
1000.0000 mg | ORAL_TABLET | Freq: Four times a day (QID) | ORAL | Status: DC
  Administered 2021-02-05 – 2021-02-10 (×19): 1000 mg via ORAL
  Filled 2021-02-05 (×21): qty 2

## 2021-02-05 MED ORDER — HYDROMORPHONE HCL 1 MG/ML IJ SOLN
0.5000 mg | Freq: Three times a day (TID) | INTRAMUSCULAR | Status: DC | PRN
Start: 2021-02-05 — End: 2021-02-10
  Administered 2021-02-06: 1 mg via INTRAVENOUS
  Administered 2021-02-06: 0.5 mg via INTRAVENOUS
  Administered 2021-02-07 – 2021-02-10 (×4): 1 mg via INTRAVENOUS
  Filled 2021-02-05 (×6): qty 1

## 2021-02-05 MED ORDER — PREGABALIN 75 MG PO CAPS
75.0000 mg | ORAL_CAPSULE | Freq: Two times a day (BID) | ORAL | Status: DC
  Administered 2021-02-05 – 2021-02-08 (×6): 75 mg via ORAL
  Filled 2021-02-05 (×7): qty 1
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
## 2021-02-05 NOTE — Progress Notes (Deleted)
.  iprehabscreen

## 2021-02-05 NOTE — Progress Notes (Signed)
? ?  Inpatient Rehab Admissions Coordinator : ? ?Per therapy recommendations, patient was screened for CIR candidacy by Stewart Pimenta RN MSN.  At this time patient appears to be a potential candidate for CIR. I will place a rehab consult per protocol for full assessment. Please call me with any questions. ? ?Olin Gurski RN MSN ?Admissions Coordinator ?[phone] ?  ?

## 2021-02-05 NOTE — Progress Notes (Signed)
Orthopedic Tech Progress Note Patient Details:  Jesus Spencer [birth year] [id number]  Ortho Devices Type of Ortho Device: Arm sling Ortho Device/Splint Location: RUE Ortho Device/Splint Interventions: Ordered, Application, Adjustment   Post Interventions Patient Tolerated: Well Instructions Provided: Care of device, Adjustment of device  Trinna Post 02/05/2021, 6:19 AM
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
## 2021-02-06 MED ORDER — CHLORHEXIDINE GLUCONATE 4 % EX LIQD: 60.0000 mL | Freq: Once | CUTANEOUS | Status: DC

## 2021-02-06 MED ORDER — TRANEXAMIC ACID-NACL 1000-0.7 MG/100ML-% IV SOLN: 1000.0000 mg | INTRAVENOUS | Status: DC

## 2021-02-06 MED ORDER — CEFAZOLIN SODIUM-DEXTROSE 2-4 GM/100ML-% IV SOLN: 2.0000 g | INTRAVENOUS | Status: DC

## 2021-02-06 NOTE — Progress Notes (Signed)
Inpatient Rehab Admissions Coordinator:   Met with patient and his mom at the bedside to discuss recommendations for CIR, expectations of supervision to mod I goals, and average length of stay about 14 days (I think pt will likely have ELOS 7-10 days).  Pt has excellent family support at home from multiple family members.  We discussed cost of care and pt is tentatively agreeable.  I let him know we could possibly screen him for Medicaid and I would make that referral.  Note pt planned for surgery tomorrow and I will f/u with him on Thursday.   Shann Medal, PT, DPT Admissions Coordinator [phone] 02/06/21  2:48 PM
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
## 2021-02-07 ENCOUNTER — Inpatient Hospital Stay (HOSPITAL_COMMUNITY): Payer: No Typology Code available for payment source | Admitting: Anesthesiology

## 2021-02-07 ENCOUNTER — Inpatient Hospital Stay (HOSPITAL_COMMUNITY): Payer: No Typology Code available for payment source

## 2021-02-07 ENCOUNTER — Encounter (HOSPITAL_COMMUNITY): Admission: EM | Disposition: A | Discharge status: Home / Self Care | Payer: Self-pay | Source: Home / Self Care

## 2021-02-07 HISTORY — PX: ORIF CLAVICULAR FRACTURE: SHX5055

## 2021-02-07 SURGERY — OPEN REDUCTION INTERNAL FIXATION (ORIF) CLAVICULAR FRACTURE
Anesthesia: General | Laterality: Right

## 2021-02-07 MED ORDER — FENTANYL CITRATE (PF) 100 MCG/2ML IJ SOLN
INTRAMUSCULAR | Status: AC
  Filled 2021-02-07: qty 2

## 2021-02-07 MED ORDER — HYDROMORPHONE HCL 1 MG/ML IJ SOLN
INTRAMUSCULAR | Status: AC
  Filled 2021-02-07: qty 1

## 2021-02-07 MED ORDER — FENTANYL CITRATE (PF) 100 MCG/2ML IJ SOLN
INTRAMUSCULAR | Status: DC | PRN
  Administered 2021-02-07 (×3): 50 ug via INTRAVENOUS
  Administered 2021-02-07: 100 ug via INTRAVENOUS

## 2021-02-07 MED ORDER — HYDROMORPHONE HCL 1 MG/ML IJ SOLN
0.2500 mg | INTRAMUSCULAR | Status: DC | PRN
  Administered 2021-02-07 (×4): 0.5 mg via INTRAVENOUS

## 2021-02-07 MED ORDER — VANCOMYCIN HCL 1000 MG IV SOLR
INTRAVENOUS | Status: DC | PRN
  Administered 2021-02-07: 1000 mg via TOPICAL

## 2021-02-07 MED ORDER — ACETAMINOPHEN 10 MG/ML IV SOLN: 1000.0000 mg | Freq: Once | INTRAVENOUS | Status: DC | PRN

## 2021-02-07 MED ORDER — DEXAMETHASONE SODIUM PHOSPHATE 4 MG/ML IJ SOLN
INTRAMUSCULAR | Status: DC | PRN
  Administered 2021-02-07: 4 mg via INTRAVENOUS

## 2021-02-07 MED ORDER — SUGAMMADEX SODIUM 200 MG/2ML IV SOLN
INTRAVENOUS | Status: DC | PRN
  Administered 2021-02-07: 200 mg via INTRAVENOUS

## 2021-02-07 MED ORDER — CEFAZOLIN SODIUM-DEXTROSE 2-3 GM-%(50ML) IV SOLR
INTRAVENOUS | Status: DC | PRN
  Administered 2021-02-07: 2 g via INTRAVENOUS

## 2021-02-07 MED ORDER — VANCOMYCIN HCL 1000 MG IV SOLR
INTRAVENOUS | Status: AC
  Filled 2021-02-07: qty 20

## 2021-02-07 MED ORDER — MIDAZOLAM HCL 2 MG/2ML IJ SOLN
INTRAMUSCULAR | Status: AC
  Filled 2021-02-07: qty 2

## 2021-02-07 MED ORDER — KETAMINE HCL 50 MG/5ML IJ SOSY
PREFILLED_SYRINGE | INTRAMUSCULAR | Status: AC
  Filled 2021-02-07: qty 5

## 2021-02-07 MED ORDER — KETAMINE HCL 10 MG/ML IJ SOLN
INTRAMUSCULAR | Status: DC | PRN
  Administered 2021-02-07: 40 mg via INTRAVENOUS
  Administered 2021-02-07: 10 mg via INTRAVENOUS

## 2021-02-07 MED ORDER — GLYCOPYRROLATE 0.2 MG/ML IJ SOLN
INTRAMUSCULAR | Status: DC | PRN
  Administered 2021-02-07 (×2): .1 mg via INTRAVENOUS

## 2021-02-07 MED ORDER — CEFAZOLIN SODIUM-DEXTROSE 2-4 GM/100ML-% IV SOLN
2.0000 g | Freq: Three times a day (TID) | INTRAVENOUS | Status: AC
  Administered 2021-02-07 (×2): 2 g via INTRAVENOUS
  Filled 2021-02-07 (×2): qty 100

## 2021-02-07 MED ORDER — OXYCODONE HCL 5 MG/5ML PO SOLN: 5.0000 mg | Freq: Once | ORAL | Status: DC | PRN

## 2021-02-07 MED ORDER — LIDOCAINE HCL (CARDIAC) PF 100 MG/5ML IV SOSY
PREFILLED_SYRINGE | INTRAVENOUS | Status: DC | PRN
  Administered 2021-02-07: 40 mg via INTRAVENOUS

## 2021-02-07 MED ORDER — TRANEXAMIC ACID-NACL 1000-0.7 MG/100ML-% IV SOLN
INTRAVENOUS | Status: AC
  Filled 2021-02-07: qty 100

## 2021-02-07 MED ORDER — OXYCODONE HCL 5 MG PO TABS: 5.0000 mg | ORAL_TABLET | Freq: Once | ORAL | Status: DC | PRN

## 2021-02-07 MED ORDER — 0.9 % SODIUM CHLORIDE (POUR BTL) OPTIME
TOPICAL | Status: DC | PRN
  Administered 2021-02-07: 1000 mL

## 2021-02-07 MED ORDER — PROPOFOL 10 MG/ML IV BOLUS
INTRAVENOUS | Status: AC
  Filled 2021-02-07: qty 20

## 2021-02-07 MED ORDER — PROPOFOL 10 MG/ML IV BOLUS
INTRAVENOUS | Status: DC | PRN
  Administered 2021-02-07: 140 mg via INTRAVENOUS

## 2021-02-07 MED ORDER — ACETAMINOPHEN 500 MG PO TABS: 1000.0000 mg | ORAL_TABLET | Freq: Once | ORAL | Status: DC | PRN

## 2021-02-07 MED ORDER — ACETAMINOPHEN 160 MG/5ML PO SOLN: 1000.0000 mg | Freq: Once | ORAL | Status: DC | PRN

## 2021-02-07 MED ORDER — LACTATED RINGERS IV SOLN: INTRAVENOUS | Status: DC | PRN

## 2021-02-07 MED ORDER — FENTANYL CITRATE (PF) 250 MCG/5ML IJ SOLN
INTRAMUSCULAR | Status: AC
  Filled 2021-02-07: qty 5

## 2021-02-07 MED ORDER — FENTANYL CITRATE (PF) 100 MCG/2ML IJ SOLN
25.0000 ug | INTRAMUSCULAR | Status: DC | PRN
  Administered 2021-02-07 (×4): 50 ug via INTRAVENOUS

## 2021-02-07 MED ORDER — MIDAZOLAM HCL 5 MG/5ML IJ SOLN
INTRAMUSCULAR | Status: DC | PRN
  Administered 2021-02-07: 2 mg via INTRAVENOUS

## 2021-02-07 MED ORDER — ROCURONIUM BROMIDE 100 MG/10ML IV SOLN
INTRAVENOUS | Status: DC | PRN
  Administered 2021-02-07: 70 mg via INTRAVENOUS
  Administered 2021-02-07 (×2): 20 mg via INTRAVENOUS

## 2021-02-07 MED ORDER — TRANEXAMIC ACID-NACL 1000-0.7 MG/100ML-% IV SOLN
INTRAVENOUS | Status: DC | PRN
  Administered 2021-02-07: 1000 mg via INTRAVENOUS

## 2021-02-07 MED FILL — Sodium Chloride IV Soln 0.9%: INTRAVENOUS | Qty: 2000 | Status: AC

## 2021-02-07 MED FILL — Heparin Sodium (Porcine) Inj 1000 Unit/ML: INTRAMUSCULAR | Qty: 30 | Status: AC

## 2021-02-07 SURGICAL SUPPLY — 57 items
APL SKNCLS STERI-STRIP NONHPOA (GAUZE/BANDAGES/DRESSINGS) ×1
BAG COUNTER SPONGE SURGICOUNT (BAG) ×2 IMPLANT
BAG SPNG CNTER NS LX DISP (BAG) ×1
BENZOIN TINCTURE PRP APPL 2/3 (GAUZE/BANDAGES/DRESSINGS) ×2 IMPLANT
BIT DRILL 3.5X5.5 QC CALB (BIT) ×1 IMPLANT
BIT DRILL CAL (BIT) IMPLANT
BIT DRILL CLAV ALPS 2.7X145 (BIT) ×1 IMPLANT
BIT DRILL SHORT ALPS 2.7 (BIT) ×1 IMPLANT
BLADE AVERAGE 25X9 (BLADE) IMPLANT
COVER SURGICAL LIGHT HANDLE (MISCELLANEOUS) ×6 IMPLANT
DRAPE IMP U-DRAPE 54X76 (DRAPES) ×2 IMPLANT
DRAPE INCISE IOBAN 66X45 STRL (DRAPES) ×2 IMPLANT
DRAPE ORTHO SPLIT 77X108 STRL (DRAPES) ×2
DRAPE SURG ORHT 6 SPLT 77X108 (DRAPES) ×1 IMPLANT
DRAPE U-SHAPE 47X51 STRL (DRAPES) ×4 IMPLANT
DRILL BIT CAL (BIT) ×2
DRSG AQUACEL AG ADV 3.5X 6 (GAUZE/BANDAGES/DRESSINGS) ×1 IMPLANT
DRSG PAD ABDOMINAL 8X10 ST (GAUZE/BANDAGES/DRESSINGS) ×2 IMPLANT
DURAPREP 26ML APPLICATOR (WOUND CARE) ×2 IMPLANT
ELECT CAUTERY BLADE 6.4 (BLADE) ×2 IMPLANT
ELECT REM PT RETURN 9FT ADLT (ELECTROSURGICAL) ×2
ELECTRODE REM PT RTRN 9FT ADLT (ELECTROSURGICAL) ×1 IMPLANT
GAUZE SPONGE 4X4 12PLY STRL (GAUZE/BANDAGES/DRESSINGS) ×2 IMPLANT
GAUZE XEROFORM 1X8 LF (GAUZE/BANDAGES/DRESSINGS) ×2 IMPLANT
GLOVE SURG ENC MOIS LTX SZ9 (GLOVE) ×2 IMPLANT
GLOVE SURG UNDER POLY LF SZ9 (GLOVE) ×2 IMPLANT
GOWN STRL REUS W/ TWL XL LVL3 (GOWN DISPOSABLE) ×3 IMPLANT
GOWN STRL REUS W/TWL XL LVL3 (GOWN DISPOSABLE) ×6
K-WIRE TROCHAR TIP ALPS 1.6 (WIRE) ×4
KIT BASIN OR (CUSTOM PROCEDURE TRAY) ×2 IMPLANT
KIT TURNOVER KIT B (KITS) ×2 IMPLANT
KWIRE TROCHAR TIP ALPS 1.6 (WIRE) IMPLANT
MANIFOLD NEPTUNE II (INSTRUMENTS) ×2 IMPLANT
NDL HYPO 25GX1X1/2 BEV (NEEDLE) IMPLANT
NEEDLE HYPO 25GX1X1/2 BEV (NEEDLE) IMPLANT
NS IRRIG 1000ML POUR BTL (IV SOLUTION) ×2 IMPLANT
PACK SHOULDER (CUSTOM PROCEDURE TRAY) ×2 IMPLANT
PACK UNIVERSAL I (CUSTOM PROCEDURE TRAY) ×2 IMPLANT
PAD ARMBOARD 7.5X6 YLW CONV (MISCELLANEOUS) ×4 IMPLANT
PLATE CLAV SUP RT 8H 900 NS (Plate) ×1 IMPLANT
SCREW CORT LP 3.5X12 (Screw) ×2 IMPLANT
SCREW CORT LP 3.5X14 (Screw) ×2 IMPLANT
SCREW CORT LP T15 3.5X16 (Screw) ×2 IMPLANT
SCREW T15 LP CORT 3.5X10MM (Screw) ×1 IMPLANT
SLING ARM IMMOBILIZER LRG (SOFTGOODS) IMPLANT
SPONGE T-LAP 4X18 ~~LOC~~+RFID (SPONGE) ×4 IMPLANT
STAPLER VISISTAT 35W (STAPLE) IMPLANT
STRIP CLOSURE SKIN 1/4X4 (GAUZE/BANDAGES/DRESSINGS) ×2 IMPLANT
SUCTION FRAZIER HANDLE 10FR (MISCELLANEOUS) ×2
SUCTION TUBE FRAZIER 10FR DISP (MISCELLANEOUS) ×1 IMPLANT
SUT VIC AB 2-0 CT1 27 (SUTURE) ×2
SUT VIC AB 2-0 CT1 TAPERPNT 27 (SUTURE) ×1 IMPLANT
SUT VICRYL 4-0 PS2 18IN ABS (SUTURE) IMPLANT
SYR CONTROL 10ML LL (SYRINGE) IMPLANT
TOWEL GREEN STERILE (TOWEL DISPOSABLE) ×2 IMPLANT
TOWEL GREEN STERILE FF (TOWEL DISPOSABLE) ×2 IMPLANT
WATER STERILE IRR 1000ML POUR (IV SOLUTION) ×2 IMPLANT
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
## 2021-02-07 NOTE — Anesthesia Procedure Notes (Signed)
Procedure Name: Intubation Date/Time: 02/07/2021 8:42 AM Performed by: Oletta Lamas, CRNA Pre-anesthesia Checklist: Patient identified, Emergency Drugs available, Suction available and Patient being monitored Patient Re-evaluated:Patient Re-evaluated prior to induction Oxygen Delivery Method: Circle System Utilized Preoxygenation: Pre-oxygenation with 100% oxygen Induction Type: IV induction Ventilation: Mask ventilation without difficulty Laryngoscope Size: Mac and 4 Grade View: Grade I Tube type: Oral Tube size: 7.5 mm Number of attempts: 1 Airway Equipment and Method: Stylet and Oral airway Placement Confirmation: ETT inserted through vocal cords under direct vision, positive ETCO2 and breath sounds checked- equal and bilateral Secured at: 23 cm Tube secured with: Tape Dental Injury: Teeth and Oropharynx as per pre-operative assessment

## 2021-02-07 NOTE — Anesthesia Preprocedure Evaluation (Signed)
Anesthesia Evaluation  Patient identified by MRN, date of birth, ID band Patient awake    Reviewed: Allergy & Precautions, NPO status , Patient's Chart, lab work & pertinent test results  History of Anesthesia Complications Negative for: history of anesthetic complications  Airway Mallampati: I  TM Distance: >3 FB Neck ROM: Full    Dental  (+) Dental Advisory Given, Teeth Intact   Pulmonary neg pulmonary ROS,    breath sounds clear to auscultation       Cardiovascular negative cardio ROS   Rhythm:Regular     Neuro/Psych negative neurological ROS  negative psych ROS   GI/Hepatic negative GI ROS, Neg liver ROS,   Endo/Other  negative endocrine ROS  Renal/GU negative Renal ROS     Musculoskeletal right clavicle fracture   Abdominal   Peds  Hematology negative hematology ROS (+)   Anesthesia Other Findings   Reproductive/Obstetrics                             Anesthesia Physical Anesthesia Plan  ASA: 1  Anesthesia Plan: General   Post-op Pain Management: Ketamine IV and Tylenol PO (pre-op)   Induction: Intravenous  PONV Risk Score and Plan: 2 and Ondansetron and Dexamethasone  Airway Management Planned: Oral ETT  Additional Equipment: None  Intra-op Plan:   Post-operative Plan: Extubation in OR  Informed Consent: I have reviewed the patients History and Physical, chart, labs and discussed the procedure including the risks, benefits and alternatives for the proposed anesthesia with the patient or authorized representative who has indicated his/her understanding and acceptance.     Dental advisory given  Plan Discussed with: CRNA and Anesthesiologist  Anesthesia Plan Comments:         Anesthesia Quick Evaluation

## 2021-02-07 NOTE — Progress Notes (Signed)
Inpatient Rehab Admissions Coordinator:   Pt to OR today.  Will f/u tomorrow.   Estill Dooms, PT, DPT Admissions Coordinator [phone] 02/07/21  10:52 AM

## 2021-02-07 NOTE — Transfer of Care (Addendum)
Immediate Anesthesia Transfer of Care Note  Patient: Jesus Spencer  Procedure(s) Performed: OPEN REDUCTION INTERNAL FIXATION (ORIF) CLAVICULAR FRACTURE (Right)  Patient Location: PACU  Anesthesia Type:General  Level of Consciousness: awake, alert , oriented and patient cooperative  Airway & Oxygen Therapy: Patient Spontanous Breathing  Post-op Assessment: Report given to RN and Post -op Vital signs reviewed and stable  Post vital signs: Reviewed and stable  Last Vitals:  Vitals Value Taken Time  BP    Temp 98   Pulse 111   Resp 15   SpO2 95     Last Pain:  Vitals:   02/07/21 0743  TempSrc: Oral  PainSc:       Patients Stated Pain Goal: 2 (02/06/21 2343)  Complications: No notable events documented.

## 2021-02-07 NOTE — Progress Notes (Signed)
PT Cancellation Note  Patient Details Name: Jesus Spencer MRN: [id number] DOB: 05/01/1995   Cancelled Treatment:    Reason Eval/Treat Not Completed: Patient at procedure or test/unavailable.  In procedure and will retry as time and pt allow.   Ivar Drape 02/07/2021, 9:17 AM  Samul Dada, PT PhD Acute Rehab Dept. Number: [facility] [id number] and [facility] [phone]

## 2021-02-07 NOTE — Op Note (Signed)
Date of Surgery: 02/07/2021  INDICATIONS: [name] is a 25 y.o.-year-old male with displaced right clavicle fracture.  The risk and benefits of the procedure with discussed in detail and documented in the pre-operative evaluation.  PREOPERATIVE DIAGNOSIS: 1. Displaced closed right clavicular shaft fracture  POSTOPERATIVE DIAGNOSIS: Same.  PROCEDURE: 1. Open reduction internal fixation of right clavicle  SURGEON: Benancio Deeds MD  ASSISTANT: Olga Millers, ATC; necessary for the timely completion of procedure and due to complexity of procedure.  ANESTHESIA:  general  IV FLUIDS AND URINE: See anesthesia record.  ANTIBIOTICS: Ancef 2g  ESTIMATED BLOOD LOSS: 25 mL.  IMPLANTS:  Implant Name Type Inv. Item Serial No. Manufacturer Lot No. LRB No. Used Action  SCREW CORT LP 3.5X14 - [id number] Screw SCREW CORT LP 3.5X14  [employer]IM[employer]ON(ORTH,TRAU,BIO,SG)  Right 2 Implanted  SCREW CORT LP 3.5X12 - [id number] Screw SCREW CORT LP 3.5X12  ZIMMER [employer]ON(ORTH,TRAU,BIO,SG)  Right 2 Implanted  SCREW CORT LP T15 3.5X16 - [id number] Screw SCREW CORT LP T15 3.5X16  [employer]ON(ORTH,TRAU,BIO,SG)  Right 2 Implanted  SCREW T15 LP CORT 3.5X10MM - [id number] Screw SCREW T15 LP CORT 3.5X10MM  ZIMMER [employer]ON(ORTH,TRAU,BIO,SG)  Right 1 Implanted  PLATE CLAV SUP RT 8H 900 NS - [id number] Plate PLATE CLAV SUP RT 8H 900 NS  ZIMMER RECON(ORTH,TRAU,BIO,SG)  Right 1 Implanted    DRAINS: None  CULTURES: None  COMPLICATIONS: none  DESCRIPTION OF PROCEDURE:  I identified the patient in the pre-operative holding area.  I marked the operative shoulder with my initials. I reviewed the risks and benefits of the proposed surgical intervention and the patient (and/or patient's guardian) wished to proceed.  Anesthesia was then performed with regional block.  The patient was transferred to the operative suite and placed in the lazy beach chair position with all bony prominences padded.  The patient was provided  general anesthesia.   SCDs were placed on the bilateral lower extremity. Appropriate antibiotics was administered within 1 hour before incision. The operative extremity was then prepped and draped in standard fashion. A time out was performed confirming the correct extremity, correct patient and correct procedure.    A 6cm incision was made over the anterior border of the clavicle and carefully taken with full thickness flaps to the clavicle. The fracture was identified. Hematoma was removed and the bone ends were cleared of debris to facilitate reduction. Two lobster claws were used to reduce the fragments and a pointed reduction clamp used to hold provisional fixation. Appropriate reduction was confirmed with direct visualization and fluoroscopy. A 3.5 mm lag screw was placed by technique to lag the fracture together.  A 8 hole Zimmer clavicle plate was placed over the superior border of clavicle. Appropriate size and position was confirmed with fluoroscopy. Non-locking screws were placed medial and lateral of the fracture site. Throughout, care was taken to protect from inadvertent drill with protection by retractors.  Fluoroscopy and direct visualization confirmed final reduction and hardware position. The wound was copiously irrigated. The wound was closed in layers with 0-vicryl, 2-0 vicryl, and 3-0 monocryl.    Instrument, sponge, and needle counts were correct prior to wound closure and at the conclusion of  the case.   The wound was dressed with steristrips, dermabond and an aquacell.   The patient awoke from anesthesia without difficulty and was transferred to the PACU in stable     [facility] ATC was necessary for opening, closing, retracting, limb positioning and overall facilitation and timely completion of  the procedure.     POSTOPERATIVE PLAN: the patient will be non weight bearing for 2 weeks on the RUE and will plan to allow him to be full AROM following that period. We will  progress him as tolerated. FU in clinic in 2 weeks  Benancio Deeds, MD 10:02 AM

## 2021-02-07 NOTE — Brief Op Note (Signed)
   Brief Op Note  Date of Surgery: 02/07/2021  Preoperative Diagnosis: right clavicle fracture  Postoperative Diagnosis: same  Procedure: Procedure(s): OPEN REDUCTION INTERNAL FIXATION (ORIF) CLAVICULAR FRACTURE  Implants: Implant Name Type Inv. Item Serial No. Manufacturer Lot No. LRB No. Used Action  SCREW CORT LP 3.5X14 - [id number] Screw SCREW CORT LP 3.5X14  ZIMMER [employer]ON(ORTH,TRAU,BIO,SG)  Right 2 Implanted  SCREW CORT LP 3.5X12 - [id number] Screw SCREW CORT LP 3.5X12  ZIMMER RECON(ORTH,TRAU,BIO,SG)  Right 2 Implanted  SCREW CORT LP T15 3.5X16 - [id number] Screw SCREW CORT LP T15 3.5X16  ZIMMER [employer]ON(ORTH,TRAU,BIO,SG)  Right 2 Implanted  SCREW T15 LP CORT 3.5X10MM - [id number] Screw SCREW T15 LP CORT 3.5X10MM  ZIMMER [employer]ON(ORTH,TRAU,BIO,SG)  Right 1 Implanted  PLATE CLAV SUP RT 8H 900 NS - [id number] Plate PLATE CLAV SUP RT 8H 900 NS  ZIMMER RECON(ORTH,TRAU,BIO,SG)  Right 1 Implanted    Surgeons: Surgeon(s): Huel Cote, MD  Anesthesia: General    Estimated Blood Loss: See anesthesia record  Complications: None  Condition to PACU: Stable  Benancio Deeds, MD 02/07/2021 10:02 AM

## 2021-02-07 NOTE — Interval H&P Note (Signed)
History and Physical Interval Note:  02/07/2021 7:46 AM  Jesus Spencer  has presented today for surgery, with the diagnosis of right clavicle fracture.  The various methods of treatment have been discussed with the patient and family. After consideration of risks, benefits and other options for treatment, the patient has consented to  Procedure(s): OPEN REDUCTION INTERNAL FIXATION (ORIF) CLAVICULAR FRACTURE (Right) as a surgical intervention.  The patient's history has been reviewed, patient examined, no change in status, stable for surgery.  I have reviewed the patient's chart and labs.  Questions were answered to the patient's satisfaction.     Huel Cote

## 2021-02-08 ENCOUNTER — Encounter (HOSPITAL_COMMUNITY): Payer: Self-pay | Admitting: Orthopaedic Surgery

## 2021-02-08 MED ORDER — TRAMADOL HCL 50 MG PO TABS
50.0000 mg | ORAL_TABLET | Freq: Four times a day (QID) | ORAL | Status: DC
  Administered 2021-02-08 – 2021-02-10 (×8): 50 mg via ORAL
  Filled 2021-02-08 (×8): qty 1

## 2021-02-08 MED ORDER — IBUPROFEN 600 MG PO TABS
600.0000 mg | ORAL_TABLET | Freq: Four times a day (QID) | ORAL | Status: DC
  Administered 2021-02-08 – 2021-02-10 (×9): 600 mg via ORAL
  Filled 2021-02-08 (×9): qty 1

## 2021-02-08 MED ORDER — KETOROLAC TROMETHAMINE 10 MG PO TABS
10.0000 mg | ORAL_TABLET | Freq: Four times a day (QID) | ORAL | Status: DC
  Filled 2021-02-08: qty 1

## 2021-02-08 NOTE — Progress Notes (Signed)
Mobility Specialist Progress Note   02/08/21 1700  Pain Assessment  Pain Location low back  Pain Descriptors / Indicators Grimacing;Sharp;Tender  Pain Intervention(s) Limited activity within patient's tolerance;Monitored during session;Repositioned  Mobility  Activity Ambulated in room  Level of Assistance Contact guard assist, steadying assist  Assistive Device Other (Comment) (Platform RW)  RUE Weight Bearing PWB  RUE Partial Weight Bearing Percentage or Pounds PWB through forearm for Platform RW  LUE Weight Bearing WBAT  Distance Ambulated (ft) 30 ft  Mobility Ambulated with assistance in room  Mobility Response Tolerated well  Mobility performed by Mobility specialist  $Mobility charge 1 Mobility   Pt needing only supervision for bed mobility and demonstrating proper mechanics for transferring to EOB. Pt did c/o LBP being at a 6/10 w/ visuals signs of discomfort. Pt able to stand w/ contact guard but having small bouts of unsteadiness corrected by themselves. While ambulating pt c/o inc pain when pivoting on LLE, limited activity on the side and returned back to bed where all needs were met.    Holland Falling Mobility Specialist Phone Number [phone]
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
## 2021-02-08 NOTE — Progress Notes (Signed)
Inpatient Rehab Admissions Coordinator:   Spoke to patient about CIR versus home.  He is concerned about cost of rehab and feels he has lots of support at home to assist with ADLs.  Spoke to PT who reports pt min guard overall for mobility.  At this time he prefers d/c home and not to CIR.  Will let trauma service and RN CM know.   Estill Dooms, PT, DPT Admissions Coordinator [phone] 02/08/21  1:18 PM

## 2021-02-08 NOTE — Progress Notes (Signed)
   Subjective:  Patient reports pain as controlled with medication.  Denies any numbness or weakness in the right arm.  Tolerating diet Objective:   VITALS:   Vitals:   02/07/21 2156 02/08/21 0300 02/08/21 0301 02/08/21 0750  BP: (!) 149/85 125/80  123/73  Pulse: 88 93  88  Resp: 16 17  18   Temp: 98.2 F (36.8 C) 98.6 F (37 C)  98.2 F (36.8 C)  TempSrc: Oral Oral  Oral  SpO2: 96% 97%  95%  Weight:   78.9 kg   Height:        Neurologically intact Neurovascular intact Sensation intact distally Intact pulses distally Fires EPL as well as wrist extensors and flexors.  Able to flex and extend the right biceps sensation intact throughout  Lab Results  Component Value Date   WBC 10.8 (H) 02/04/2021   HGB 9.5 (L) 02/04/2021   HCT 27.9 (L) 02/04/2021   MCV 88.0 02/04/2021   PLT 538 (H) 02/04/2021     Assessment/Plan:  1 Day Post-Op status post right clavicle open reduction internal fixation   - Patient to work with PT/OT to optimize mobilization safely - DVT ppx - SCDs, ambulation, Lovenox - Postoperative Abx: Ancef x 2 additional doses given -With regard to his right shoulder he may use the right arm for weightbearing through walker with the arm held close to the side.  I would not like him to otherwise be doing active range of motion or active lifting with the right shoulder.  He may work actively on biceps as well as wrist and fingers.  I will plan to advance his active range of motion in 2 weeks - Pain control - multimodal pain management, ATC acetaminophen in conjunction with as needed narcotic (oxycodone), although this should be minimized with other modalities  -We will update follow-up information  14/06/2020 02/08/2021, 8:57 AM

## 2021-02-08 NOTE — Anesthesia Postprocedure Evaluation (Signed)
Anesthesia Post Note  Patient: Jesus Spencer  Procedure(s) Performed: OPEN REDUCTION INTERNAL FIXATION (ORIF) CLAVICULAR FRACTURE (Right)     Patient location during evaluation: PACU Anesthesia Type: General Level of consciousness: awake and alert Pain management: pain level controlled Vital Signs Assessment: post-procedure vital signs reviewed and stable Respiratory status: spontaneous breathing, nonlabored ventilation, respiratory function stable and patient connected to nasal cannula oxygen Cardiovascular status: blood pressure returned to baseline and stable Postop Assessment: no apparent nausea or vomiting Anesthetic complications: no   No notable events documented.  Last Vitals:  Vitals:   02/08/21 0300 02/08/21 0750  BP: 125/80 123/73  Pulse: 93 88  Resp: 17 18  Temp: 37 C 36.8 C  SpO2: 97% 95%    Last Pain:  Vitals:   02/08/21 0948  TempSrc:   PainSc: 4                  Mahir Prabhakar
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
## 2021-02-08 NOTE — Progress Notes (Signed)
Subjective: Patient reports doing much better today, not as much back pain. No NT in his legs and urinating well.  Objective: Vital signs in last 24 hours: Temp:  [97.7 F (36.5 C)-98.6 F (37 C)] 98.2 F (36.8 C) (12/08 0750) Pulse Rate:  [87-101] 88 (12/08 0750) Resp:  [15-19] 18 (12/08 0750) BP: (123-156)/(57-89) 123/73 (12/08 0750) SpO2:  [90 %-97 %] 95 % (12/08 0750) Weight:  [78.9 kg] 78.9 kg (12/08 0301)  Intake/Output from previous day: 12/07 0701 - 12/08 0700 In: 1440 [P.O.:240; I.V.:1200] Out: 25 [Blood:25] Intake/Output this shift: No intake/output data recorded.  Neurologic: Grossly normal  Lab Results: Lab Results  Component Value Date   WBC 10.8 (H) 02/04/2021   HGB 9.5 (L) 02/04/2021   HCT 27.9 (L) 02/04/2021   MCV 88.0 02/04/2021   PLT 538 (H) 02/04/2021   Lab Results  Component Value Date   INR 1.1 01/27/2021   BMET Lab Results  Component Value Date   NA 137 02/04/2021   K 3.6 02/04/2021   CL 104 02/04/2021   CO2 25 02/04/2021   GLUCOSE 96 02/04/2021   BUN 12 02/04/2021   CREATININE 0.52 (L) 02/04/2021   CALCIUM 8.2 (L) 02/04/2021    Studies/Results: DG Clavicle Right  Result Date: 02/07/2021 CLINICAL DATA:  Fracture clavicle EXAM: RIGHT CLAVICLE - 2+ VIEWS COMPARISON:  01/28/2021 FINDINGS: Fluoroscopic images show internal fixation of fracture of clavicle with metallic plate and screws. Right scapula is not adequately visualized for evaluation. Fluoroscopic time was 10 seconds. Radiation dose is 0.69 mGy. IMPRESSION: Fluoroscopic assistance was provided for internal fixation of displaced fracture of right clavicle. Electronically Signed   By: Ernie Avena M.D.   On: 02/07/2021 11:16   DG C-Arm 1-60 Min-No Report  Result Date: 02/07/2021 Fluoroscopy was utilized by the requesting physician.  No radiographic interpretation.   DG C-Arm 1-60 Min-No Report  Result Date: 02/07/2021 Fluoroscopy was utilized by the requesting physician.   No radiographic interpretation.    Assessment/Plan: Postop day 8 thoracolumbar fusion. Doing well, continue therapy. Ok to discharge to rehab when ready.    LOS: 11 days    Tiana Loft Charly Hunton 02/08/2021, 8:11 AM
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
## 2021-02-09 ENCOUNTER — Other Ambulatory Visit (HOSPITAL_COMMUNITY): Payer: Self-pay

## 2021-02-09 MED ORDER — ACETAMINOPHEN 500 MG PO TABS
1000.0000 mg | ORAL_TABLET | Freq: Four times a day (QID) | ORAL | 0 refills | Status: AC | PRN
  Filled 2021-02-09: qty 60, 8d supply, fill #0

## 2021-02-09 MED ORDER — BISACODYL 10 MG RE SUPP
10.0000 mg | Freq: Every day | RECTAL | 0 refills | Status: AC
  Filled 2021-02-09: qty 30, 30d supply, fill #0

## 2021-02-09 MED ORDER — POLYETHYLENE GLYCOL 3350 17 GM/SCOOP PO POWD
17.0000 g | Freq: Two times a day (BID) | ORAL | 0 refills | Status: AC
  Filled 2021-02-09: qty 238, 7d supply, fill #0

## 2021-02-09 MED ORDER — METHOCARBAMOL 500 MG PO TABS
1000.0000 mg | ORAL_TABLET | Freq: Three times a day (TID) | ORAL | 0 refills | Status: AC | PRN
  Filled 2021-02-09: qty 120, 20d supply, fill #0

## 2021-02-09 MED ORDER — PREGABALIN 150 MG PO CAPS
150.0000 mg | ORAL_CAPSULE | Freq: Two times a day (BID) | ORAL | 0 refills | Status: AC
  Filled 2021-02-09: qty 60, 30d supply, fill #0

## 2021-02-09 MED ORDER — TAMSULOSIN HCL 0.4 MG PO CAPS
0.4000 mg | ORAL_CAPSULE | Freq: Every day | ORAL | Status: DC
  Administered 2021-02-09 – 2021-02-10 (×2): 0.4 mg via ORAL
  Filled 2021-02-09: qty 1

## 2021-02-09 MED ORDER — QUETIAPINE FUMARATE 50 MG PO TABS
25.0000 mg | ORAL_TABLET | Freq: Every day | ORAL | Status: DC
  Administered 2021-02-09: 25 mg via ORAL
  Filled 2021-02-09: qty 1

## 2021-02-09 MED ORDER — DOCUSATE SODIUM 100 MG PO CAPS
100.0000 mg | ORAL_CAPSULE | Freq: Two times a day (BID) | ORAL | 0 refills | Status: AC
  Filled 2021-02-09: qty 60, 30d supply, fill #0

## 2021-02-09 MED ORDER — OXYCODONE HCL 10 MG PO TABS
10.0000 mg | ORAL_TABLET | Freq: Four times a day (QID) | ORAL | 0 refills | Status: AC | PRN
Start: 2021-02-09 — End: ?
  Filled 2021-02-09: qty 30, 5d supply, fill #0

## 2021-02-09 MED ORDER — TRAMADOL HCL 50 MG PO TABS
50.0000 mg | ORAL_TABLET | Freq: Four times a day (QID) | ORAL | 0 refills | Status: AC | PRN
  Filled 2021-02-09: qty 30, 7d supply, fill #0

## 2021-02-09 MED ORDER — ENOXAPARIN SODIUM 30 MG/0.3ML IJ SOSY
30.0000 mg | PREFILLED_SYRINGE | Freq: Two times a day (BID) | INTRAMUSCULAR | 0 refills | Status: AC
  Filled 2021-02-09: qty 8.4, 14d supply, fill #0

## 2021-02-09 MED ORDER — IBUPROFEN 600 MG PO TABS
600.0000 mg | ORAL_TABLET | Freq: Four times a day (QID) | ORAL | 0 refills | Status: AC | PRN
  Filled 2021-02-09: qty 30, 8d supply, fill #0

## 2021-02-09 MED ORDER — PREGABALIN 75 MG PO CAPS
150.0000 mg | ORAL_CAPSULE | Freq: Two times a day (BID) | ORAL | Status: DC
  Administered 2021-02-09 – 2021-02-10 (×3): 150 mg via ORAL
  Filled 2021-02-09 (×3): qty 2

## 2021-02-09 NOTE — TOC Progression Note (Incomplete)
Transition of Care [facility]) - Progression Note    Patient Details  Name: Caton Popowski MRN: [id number] Date of Birth: [birth year]  Transition of Care [facility] CM/SW Contact  Glennon Mac, RN Phone Number: 02/09/2021, 2:13 PM  Clinical Narrative:    Patient for possible discharge over the weekend; patient states he will discharge home with his parents and 4 sisters to assist with care.  Patient evaluated for charity home health services, though unfortunately home health agency does not have adequate staffing to accommodate patient.  Patient agreeable to outpatient rehab follow-up; referral made to [facility] health outpatient rehab on [facility] for continued therapies at discharge.  Patient is uninsured; discharge med sent to [facility] pharmacy and have been filled using match letter.  Patient has recommended DME at bedside, provided by [employer].    Expected Discharge Plan: OP Rehab Barriers to Discharge: Continued Medical Work up  Expected Discharge Plan and Services Expected Discharge Plan: OP Rehab   Discharge Planning Services: CM Consult Post Acute Care Choice: Home Health Living arrangements for the past 2 months: Single Family Home                 DME Arranged: 3-N-1, Walker rolling DME Agency: [employer] Date DME Agency Contacted: 02/08/21 Time DME Agency Contacted: [phone] Representative spoke with at DME Agency: Velna Hatchet             Social Determinants of Health (SDOH) Interventions    Readmission Risk Interventions No flowsheet data found.
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
## 2021-02-09 NOTE — Progress Notes (Signed)
Subjective: Patient reports mild back pain that is improving. He has been more mobile. Denies any NTW or pain in his legs  Objective: Vital signs in last 24 hours: Temp:  [98 F (36.7 C)-98.3 F (36.8 C)] 98 F (36.7 C) (12/09 0739) Pulse Rate:  [82-91] 82 (12/09 0739) Resp:  [16-18] 16 (12/09 0739) BP: (115-126)/(66-78) 115/66 (12/09 0739) SpO2:  [96 %-97 %] 97 % (12/09 0739) Weight:  [76.1 kg] 76.1 kg (12/09 0500)  Intake/Output from previous day: No intake/output data recorded. Intake/Output this shift: No intake/output data recorded.  Neurologic: Grossly normal  Lab Results: Lab Results  Component Value Date   WBC 10.8 (H) 02/04/2021   HGB 9.5 (L) 02/04/2021   HCT 27.9 (L) 02/04/2021   MCV 88.0 02/04/2021   PLT 538 (H) 02/04/2021   Lab Results  Component Value Date   INR 1.1 01/27/2021   BMET Lab Results  Component Value Date   NA 137 02/04/2021   K 3.6 02/04/2021   CL 104 02/04/2021   CO2 25 02/04/2021   GLUCOSE 96 02/04/2021   BUN 12 02/04/2021   CREATININE 0.52 (L) 02/04/2021   CALCIUM 8.2 (L) 02/04/2021    Studies/Results: No results found.  Assessment/Plan: S/p thoracolumbar fusion and doing really well. His incision is CDI and no drainage. Sounds like he will likely be discharged home instead of rehab. We will see him in the office 2 weeks after he is discharged.   LOS: 12 days    Tiana Loft Fletcher Ostermiller 02/09/2021, 12:03 PM
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
## 2021-02-09 NOTE — Progress Notes (Signed)
   Subjective:  Patient reports pain as controlled with medication. Shoulder did not bother him with mobility.  Objective:   VITALS:   Vitals:   02/08/21 2114 02/09/21 0324 02/09/21 0500 02/09/21 0739  BP: 126/78 120/78  115/66  Pulse: 91 89  82  Resp: 18 18  16   Temp: 98.1 F (36.7 C) 98.3 F (36.8 C)  98 F (36.7 C)  TempSrc: Oral Oral  Oral  SpO2: 97% 96%  97%  Weight:   76.1 kg   Height:        Neurologically intact Neurovascular intact Sensation intact distally Intact pulses distally Fires EPL as well as wrist extensors and flexors.  Able to flex and extend the right biceps sensation intact throughout  Lab Results  Component Value Date   WBC 10.8 (H) 02/04/2021   HGB 9.5 (L) 02/04/2021   HCT 27.9 (L) 02/04/2021   MCV 88.0 02/04/2021   PLT 538 (H) 02/04/2021     Assessment/Plan:  2 Days Post-Op status post right clavicle open reduction internal fixation   - Patient to work with PT/OT to optimize mobilization safely - DVT ppx - SCDs, ambulation, Lovenox -With regard to his right shoulder he may use the right arm for weightbearing through walker with the arm held close to the side.  I would not like him to otherwise be doing active range of motion or active lifting with the right shoulder.  He may work actively on biceps as well as wrist and fingers.  I will plan to advance his active range of motion in 2 weeks - Pain control - multimodal pain management, ATC acetaminophen in conjunction with as needed narcotic (oxycodone), although this should be minimized with other modalities  -We will update follow-up information  Jesus Spencer 02/09/2021, 7:55 AM
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
## 2021-02-10 ENCOUNTER — Inpatient Hospital Stay (HOSPITAL_COMMUNITY): Payer: No Typology Code available for payment source

## 2021-02-10 NOTE — Plan of Care (Signed)
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
## 2021-02-14 ENCOUNTER — Encounter (HOSPITAL_COMMUNITY): Payer: Self-pay | Admitting: Orthopaedic Surgery

## 2021-02-16 ENCOUNTER — Other Ambulatory Visit (HOSPITAL_COMMUNITY): Payer: Self-pay

## 2021-02-16 NOTE — Progress Notes (Signed)
Acute hypoxic respiratory failure 

## 2021-03-01 ENCOUNTER — Other Ambulatory Visit (HOSPITAL_COMMUNITY): Payer: Self-pay

## 2021-03-01 ENCOUNTER — Other Ambulatory Visit (HOSPITAL_COMMUNITY): Payer: Self-pay | Admitting: Physician Assistant

## 2021-03-02 ENCOUNTER — Other Ambulatory Visit (HOSPITAL_COMMUNITY): Payer: Self-pay

## 2021-03-15 ENCOUNTER — Other Ambulatory Visit: Payer: Self-pay | Admitting: Student

## 2021-03-15 ENCOUNTER — Other Ambulatory Visit (HOSPITAL_COMMUNITY): Payer: Self-pay | Admitting: Student

## 2021-03-15 DIAGNOSIS — S22088A Other fracture of T11-T12 vertebra, initial encounter for closed fracture: Secondary | ICD-10-CM

## 2021-03-19 ENCOUNTER — Ambulatory Visit (HOSPITAL_COMMUNITY)
Admission: RE | Admit: 2021-03-19 | Discharge: 2021-03-19 | Disposition: A | Discharge status: Home / Self Care | Payer: Self-pay | Source: Ambulatory Visit | Attending: Student | Admitting: Student

## 2021-03-19 ENCOUNTER — Encounter (HOSPITAL_COMMUNITY): Payer: Self-pay

## 2021-03-19 ENCOUNTER — Other Ambulatory Visit: Payer: Self-pay

## 2021-03-19 DIAGNOSIS — S22088A Other fracture of T11-T12 vertebra, initial encounter for closed fracture: Secondary | ICD-10-CM

## 2021-10-18 ENCOUNTER — Other Ambulatory Visit (HOSPITAL_COMMUNITY): Payer: Self-pay | Admitting: Student

## 2021-10-18 ENCOUNTER — Other Ambulatory Visit: Payer: Self-pay | Admitting: Student

## 2021-10-18 DIAGNOSIS — S22088A Other fracture of T11-T12 vertebra, initial encounter for closed fracture: Secondary | ICD-10-CM

## 2021-10-30 ENCOUNTER — Ambulatory Visit (HOSPITAL_COMMUNITY): Payer: Self-pay

## 2021-10-30 ENCOUNTER — Ambulatory Visit (HOSPITAL_COMMUNITY)
Admission: RE | Admit: 2021-10-30 | Discharge: 2021-10-30 | Disposition: A | Discharge status: Home / Self Care | Payer: Self-pay | Source: Ambulatory Visit | Attending: Student | Admitting: Student

## 2021-10-30 DIAGNOSIS — S22088A Other fracture of T11-T12 vertebra, initial encounter for closed fracture: Secondary | ICD-10-CM | POA: Insufficient documentation
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
## 2022-11-28 IMAGING — MR MR LUMBAR SPINE W/O CM
4 of 5 series · 18 of 48 positions shown · non-contrast
Comparison: None.

CLINICAL DATA: Back trauma with abnormal x-ray.

EXAM:
MRI THORACIC AND LUMBAR SPINE WITHOUT CONTRAST
TECHNIQUE: Multiplanar and multiecho pulse sequences of the thoracic and lumbar
spine were obtained without intravenous contrast.

[Series 3: T2 · sagittal · 4.0mm · 0.55mm/px · 6 of 15 slices shown (1 of 2)]
[im 1/15]
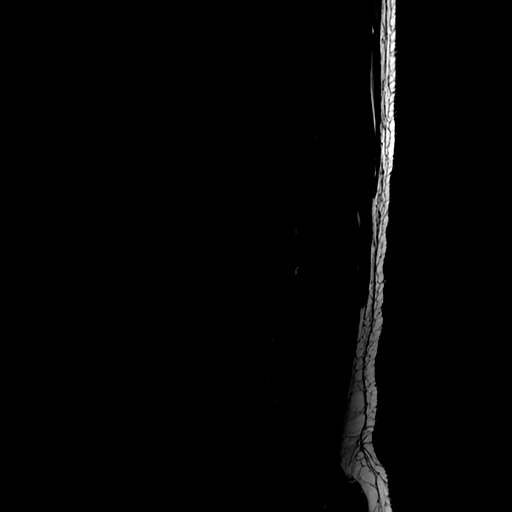
[im 3/15]
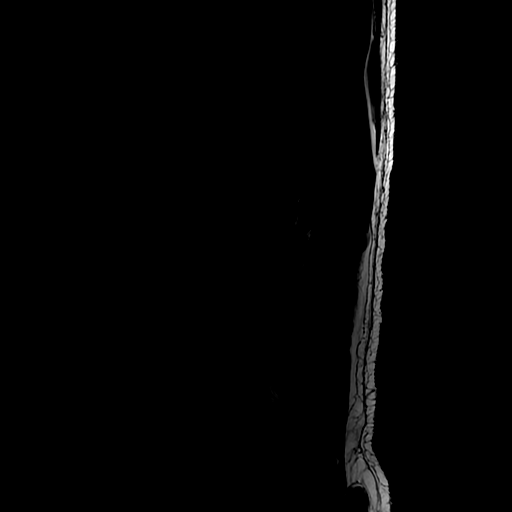
[im 6/15]
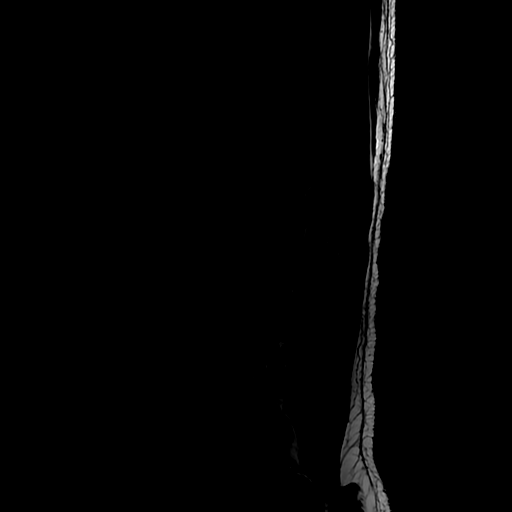
[im 9/15]
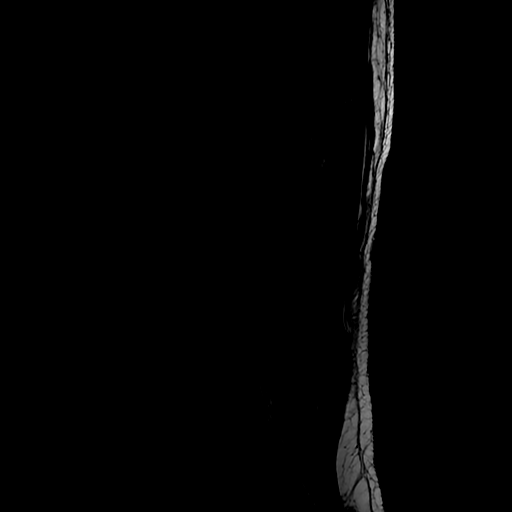
[im 12/15]
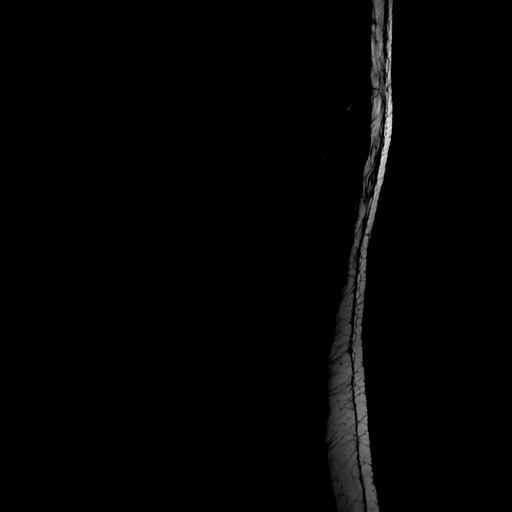
[im 15/15]
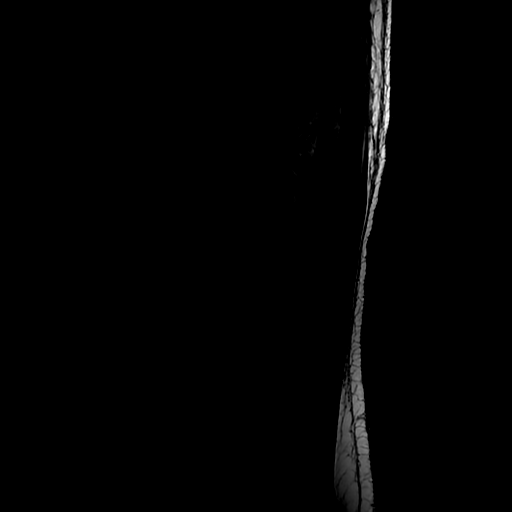

[Series 5: T1 · sagittal · 4.0mm · 0.55mm/px · 3 of 15 slices shown (1 of 2)]
[im 1/15]
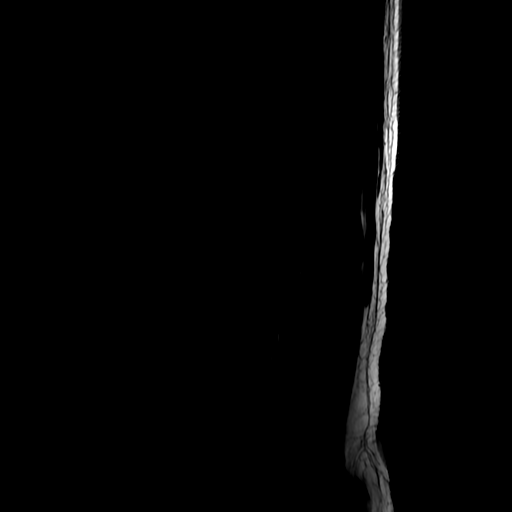
[im 8/15]
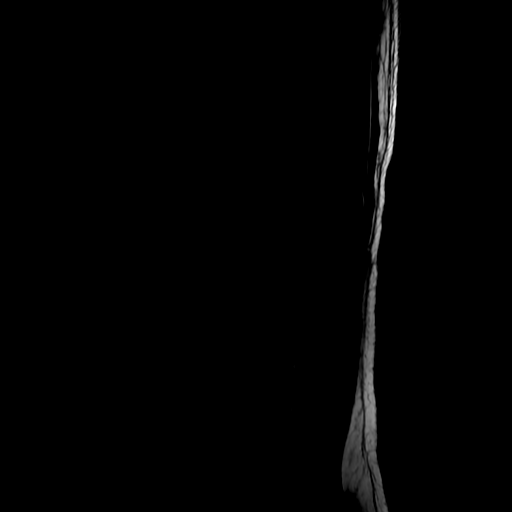
[im 15/15]
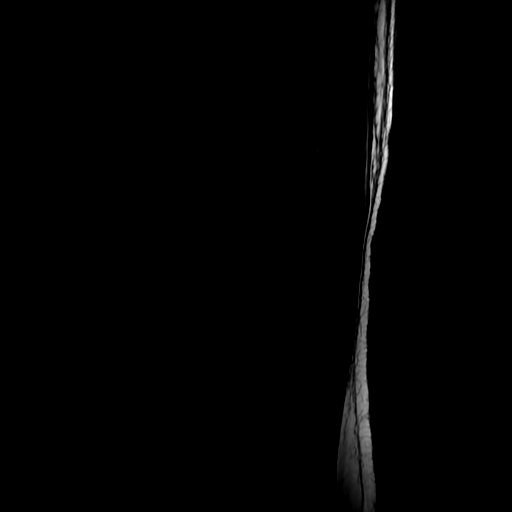

[Series 6: T2 · axial · 4.0mm · 0.39mm/px · z∈[-113,+75]mm · 6 of 49 slices shown (2 of 2)]
[im 4/49]
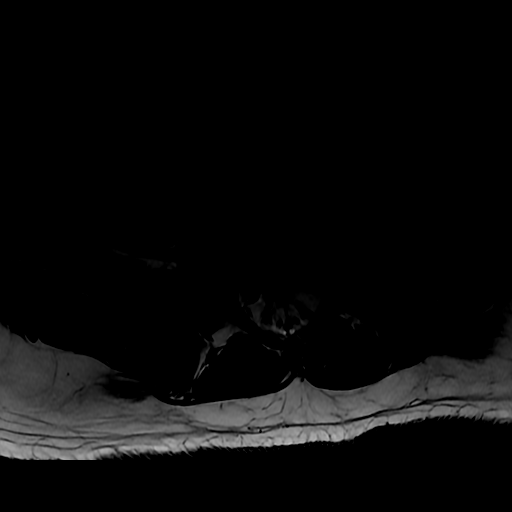
[im 7/49]
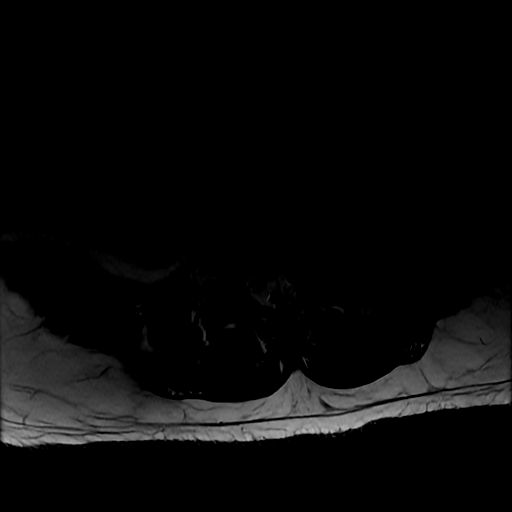
[im 10/49]
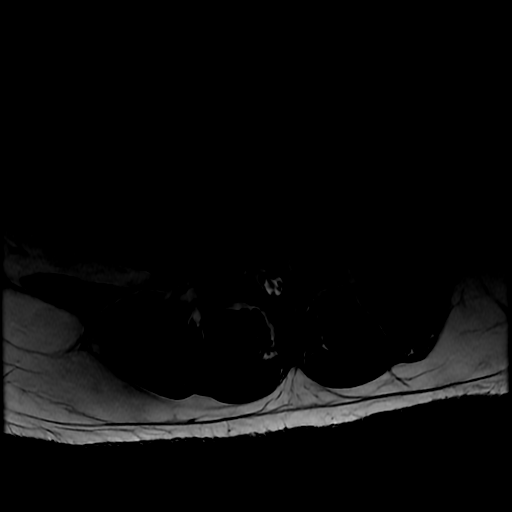
[im 17/49]
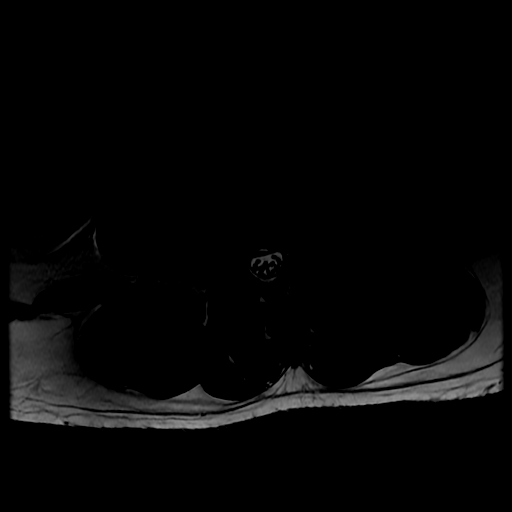
[im 26/49]
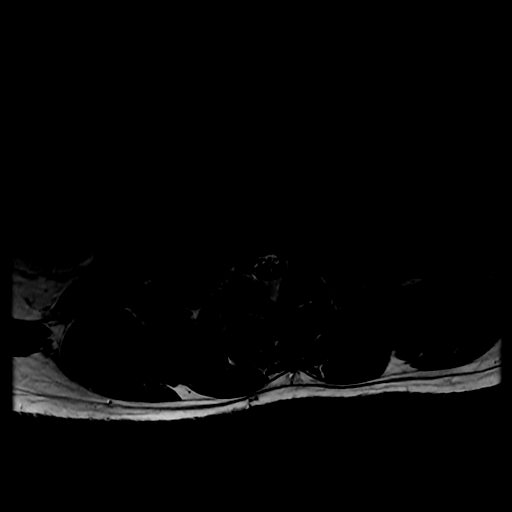
[im 42/49]
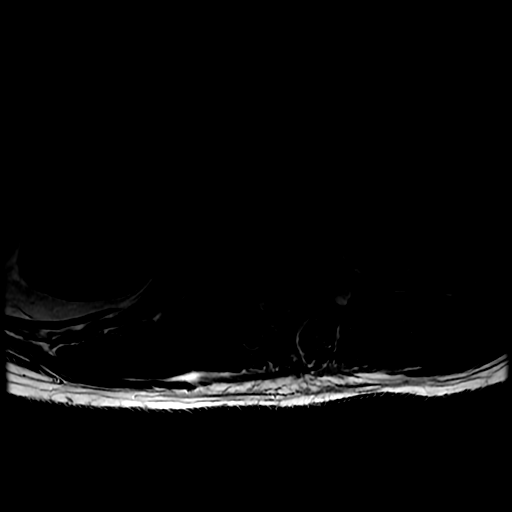

[Series 7: T1 · axial · 4.0mm · 0.39mm/px · z∈[-98,+75]mm · 3 of 49 slices shown (2 of 2)]
[im 7/49]
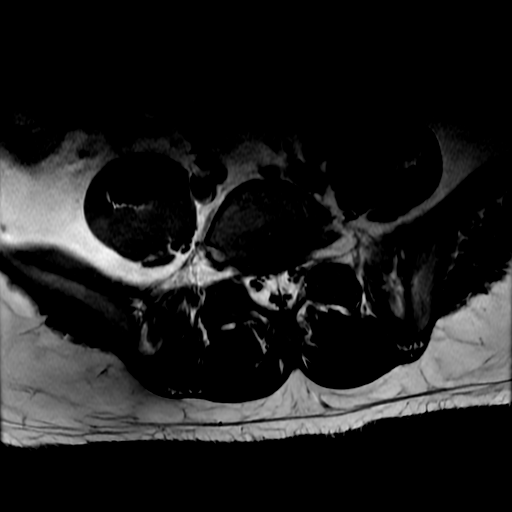
[im 26/49]
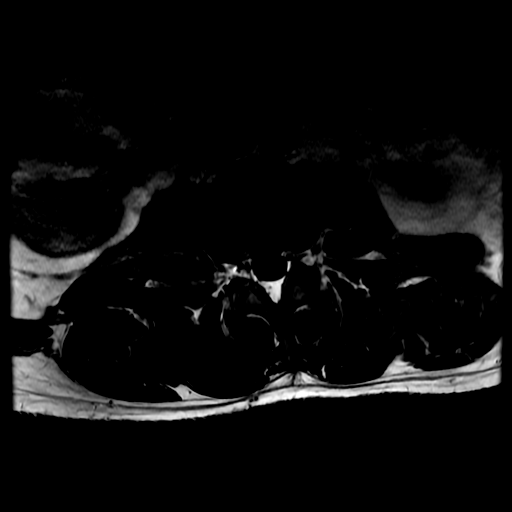
[im 42/49]
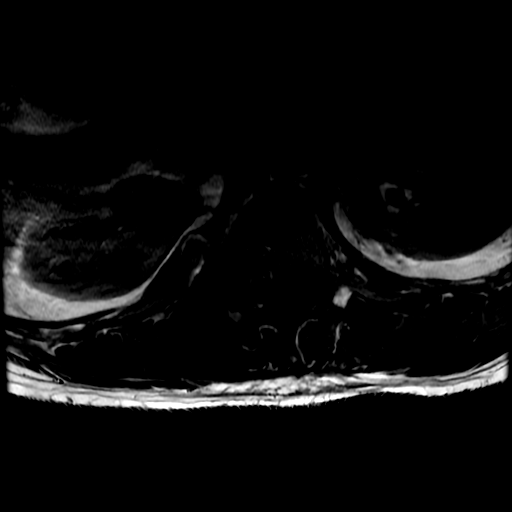

[18 of 48 positions shown; findings below may reference images not displayed]

FINDINGS: MRI THORACIC SPINE FINDINGS

Alignment: Negative for listhesis. There is a kyphotic deformity
related to T12 Chance fracture.

Vertebrae: Chance type fracture at T8 with vertical oblique
involvement through the posterior body exiting the posterior
elements and the T7 spinous process with complete disruption of the
ligamentum flavum at T7-8. T9 superior endplate fracture with
oblique appearance by CT. Mild superior endplate depression.

T12 Chance type fracture with branching fracture plane and diffuse
marrow edema in the body. The fracture exits the widened posterior
elements with complete disruption of the T11-12 ligamentum flavum.
Facet perching by prior CT is less apparent.

Inter spinous strain at T4-5 without ligamentum flavum disruption at
this level. A supraspinous ligament also appears continuous at this
level.

Cord: No visible cord contusion or hemorrhage. There is dorsal
epidural hemorrhage from T3 into the lumbar spine, thickest at the
level of T12 at up to 7 mm. This partially effaces the subarachnoid
space without cord compression.

Paraspinal and other soft tissues: Extensive intrinsic back muscle
strain with tearing of the right para median right trapezius where
there is intramuscular hemorrhage.

Disc levels:

No traumatic herniation.

MRI LUMBAR SPINE FINDINGS

Segmentation:  5 lumbar type vertebrae

Alignment:  Negative for lumbar listhesis.

Vertebrae:  No occult fracture.

Conus medullaris and cauda equina: Conus extends to the T12-L1
level. No visible conus edema. Epidural hemorrhage a T12 and L1 with
thecal sac crowding.

Paraspinal and other soft tissues: As described by CT

Disc levels:

No degenerative changes or impingement
IMPRESSION: 1. T12 Chance fracture with widening of the posterior element
fracture and complete tear at the ligamentum flavum/posterior
tension band of T11-12.
2. Chance type fracture pattern involving the T7-T9 vertebrae with
ligamentum flavum/posterior tension band tear at T7-8.
3. Prominent inter spinous sprain at T4-5. Extensive strain of
intrinsic back muscles with partial tearing and intramuscular
hemorrhage at the right paramedian trapezius.
4. Dorsal epidural hemorrhage from T3 to L1 with crowding of the
thecal sac. No cord compression or cord edema.

## 2022-11-29 IMAGING — DX DG CHEST 1V PORT
1 series · 1 of 1 positions shown · non-contrast
Comparison: Chest x-ray 01/28/2021.

CLINICAL DATA: Respiratory failure.

EXAM:
PORTABLE CHEST 1 VIEW

[chest]
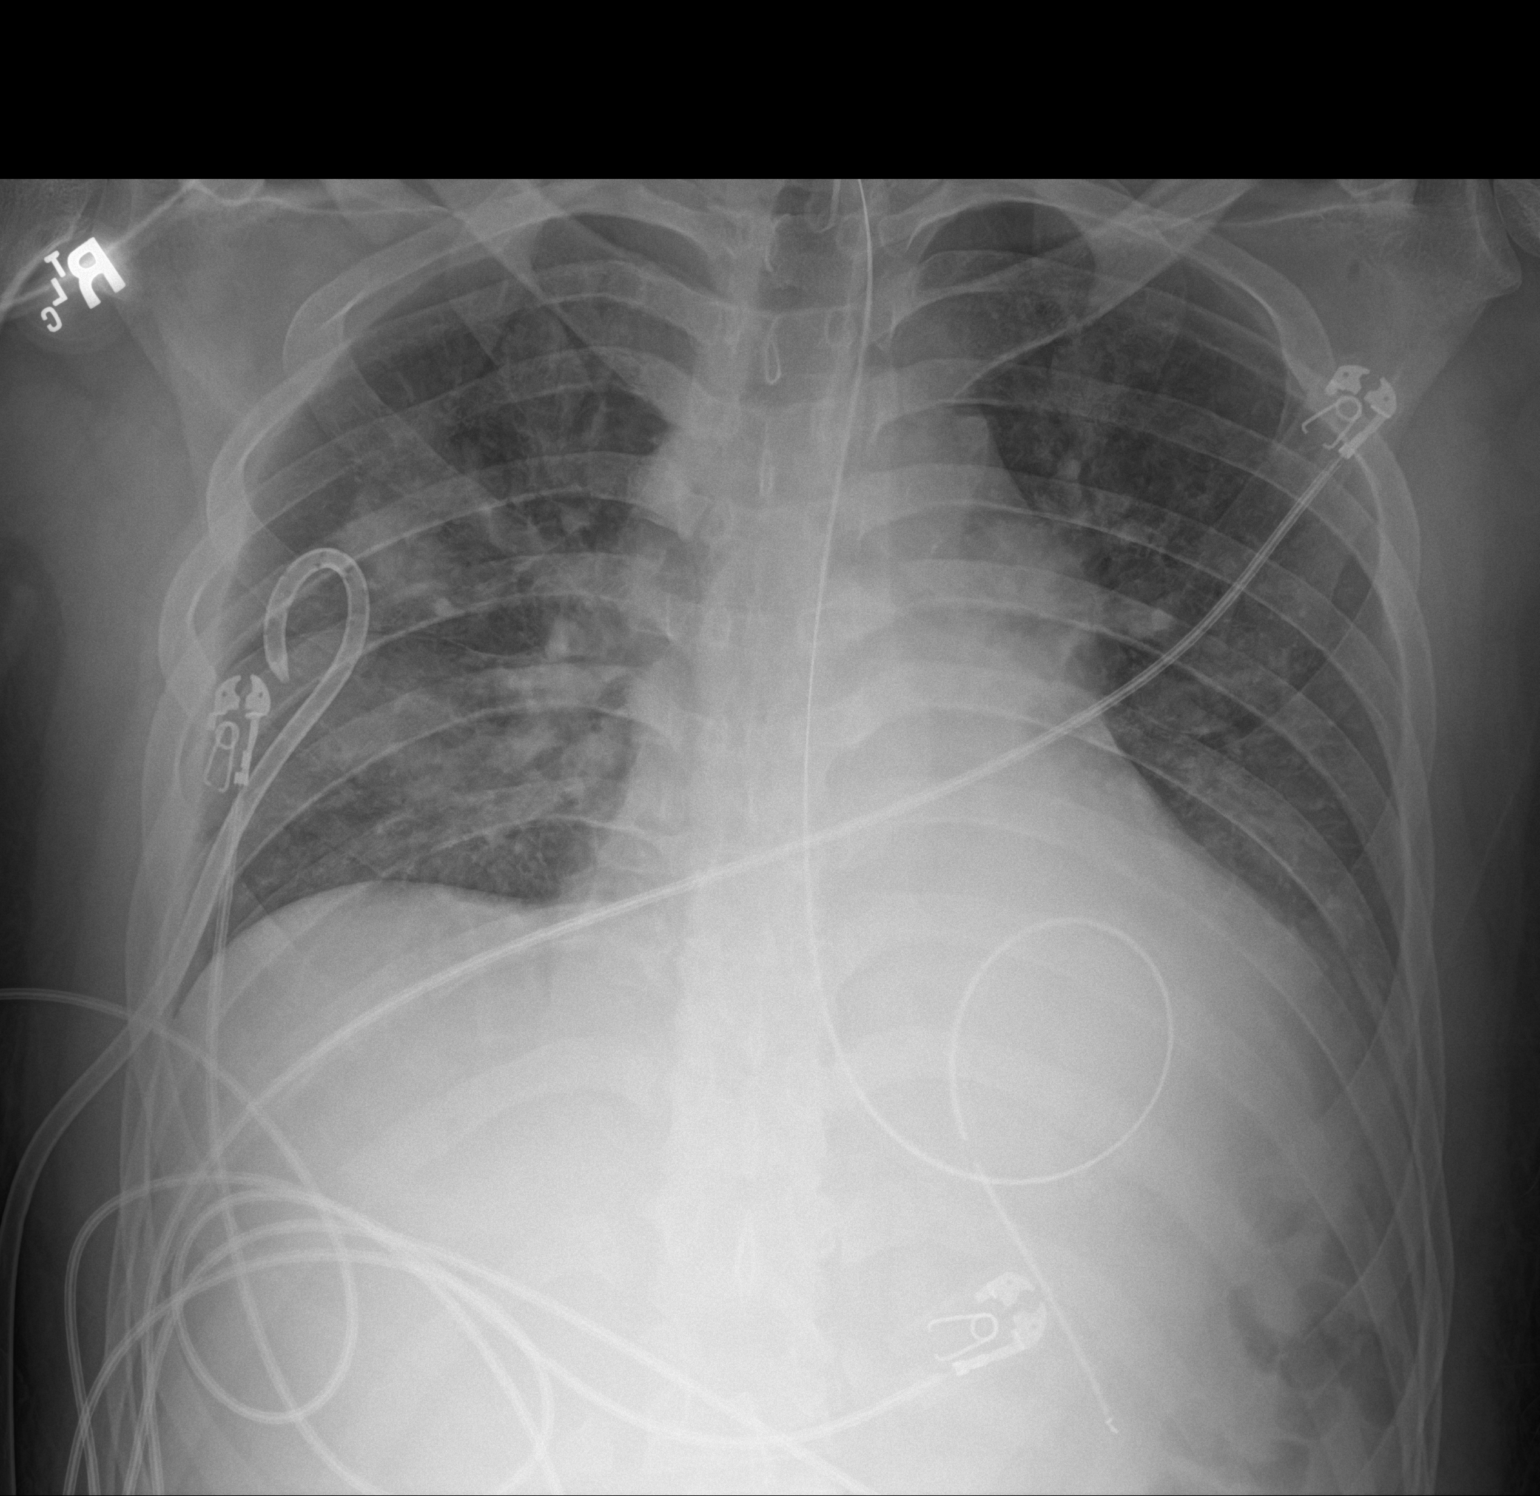

[1 of 1 positions shown; findings below may reference images not displayed]

FINDINGS: Endotracheal tube tip is 6 cm above the carina similar to the prior
study. Enteric tube tip is in the gastric body. Right pleural
catheter is unchanged in position.

There is no pneumothorax. There is a small right pleural effusion
which is unchanged. Patchy airspace opacities in the right mid lung
are unchanged. There is a new small left pleural effusion with
minimal left basilar opacities. Cardiomediastinal silhouette is
stable. Right clavicular fracture is again noted.
IMPRESSION: 1. Endotracheal tube tip 6 cm above the carina, consider
repositioning.
2. Small right pleural effusion is unchanged. Right pleural drainage
catheter in place.
3. New small left pleural effusion with minimal left basilar
atelectasis/airspace disease.
4. Stable right lung airspace disease compatible with pulmonary
contusion.

## 2022-11-30 IMAGING — DX DG CHEST 1V PORT
1 series · 1 of 1 positions shown · non-contrast
Comparison: Chest radiograph 1 day prior

CLINICAL DATA: Respiratory failure

EXAM:
PORTABLE CHEST 1 VIEW

[chest]
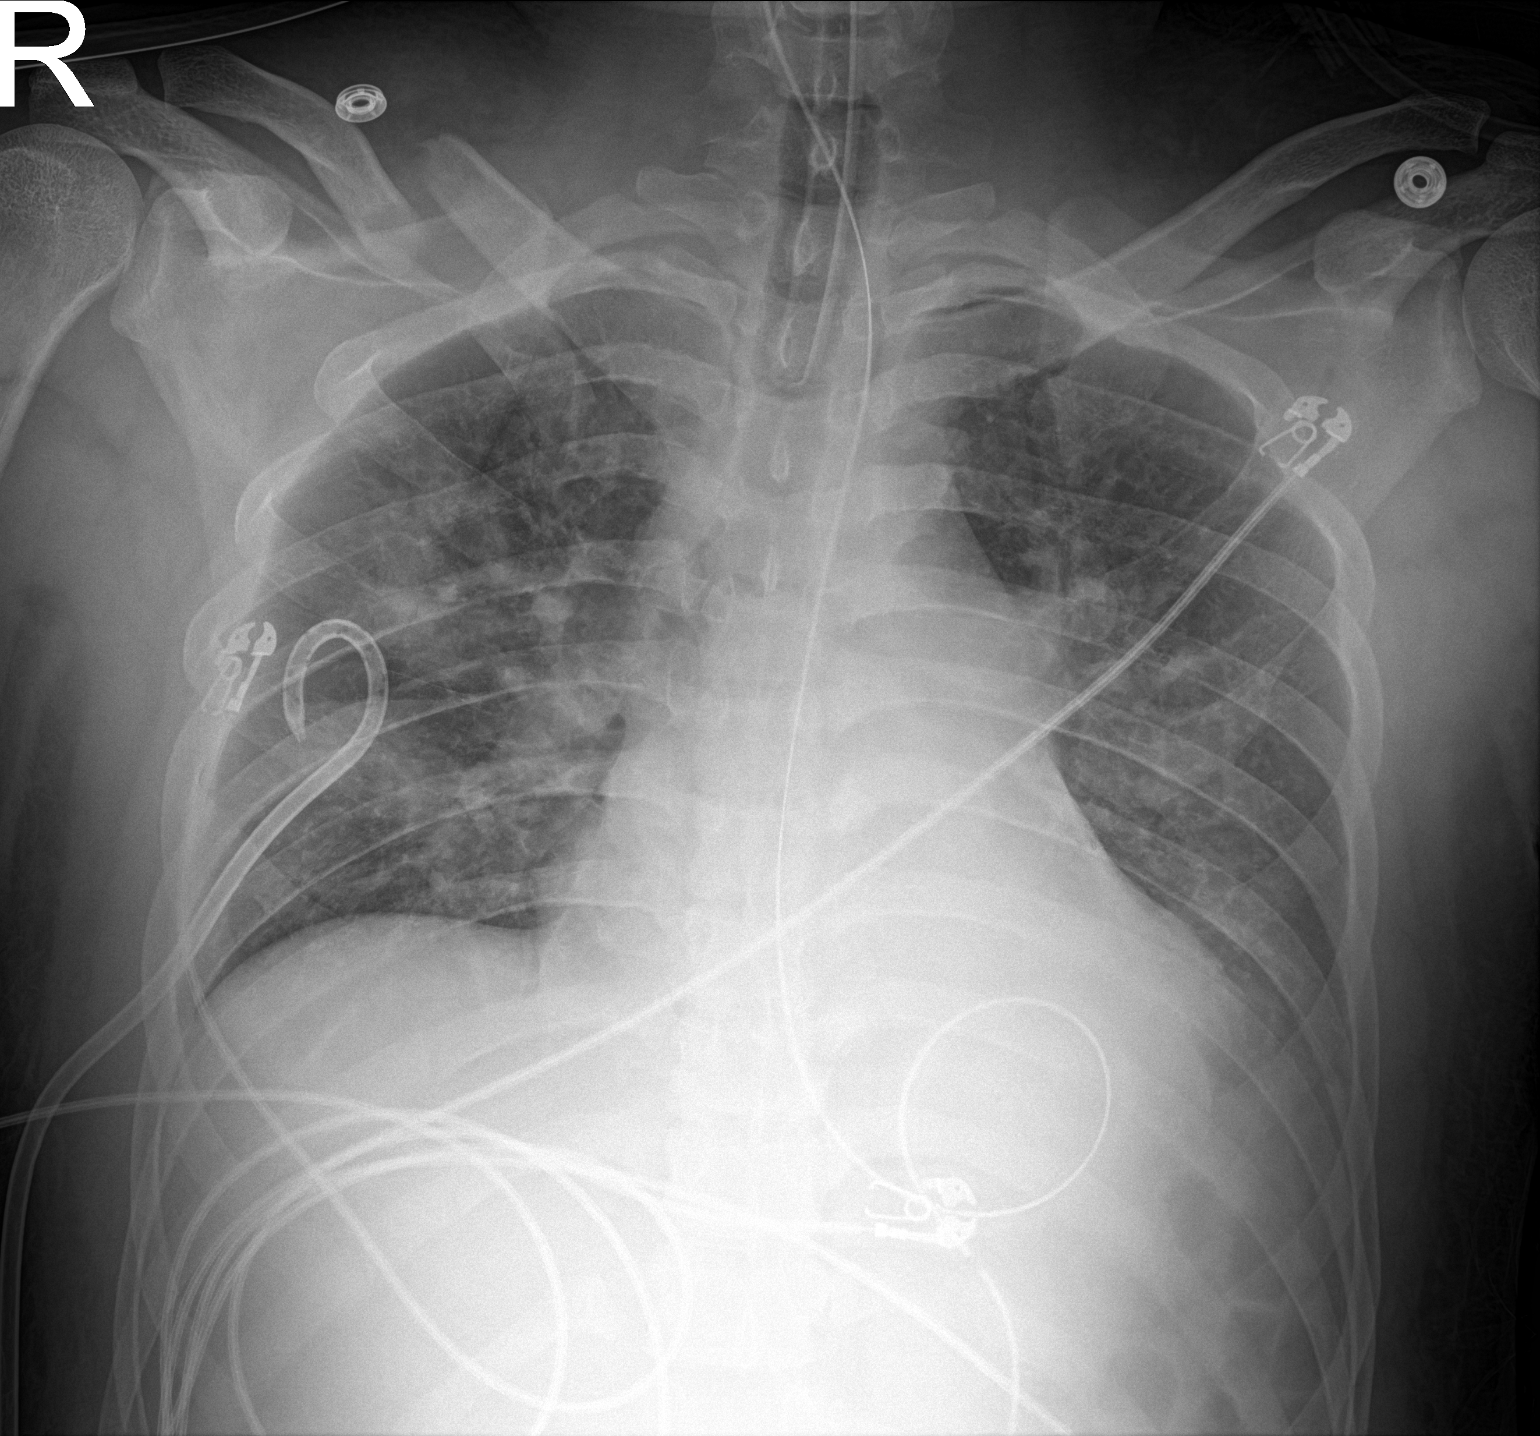

[1 of 1 positions shown; findings below may reference images not displayed]

FINDINGS: The endotracheal tube is in the midthoracic trachea. The enteric
catheter is coiled in the stomach, the tip is off the field of view.
The right-sided pigtail catheter is stable.

The cardiomediastinal silhouette is stable.

Lung volumes are low. Patchy perihilar opacities and left
basilar/retrocardiac opacity is similar to the prior study. A small
left pleural effusion and trace right pleural effusion are not
significantly changed. There is no pneumothorax.

A displaced right clavicular fracture is unchanged.
IMPRESSION: 1. Endotracheal tube in satisfactory position.
2. Unchanged patchy perihilar opacities, left basilar opacity, and
small left and trace right pleural effusions.

## 2022-12-01 IMAGING — DX DG CHEST 1V PORT
1 series · 1 of 1 positions shown · non-contrast
Comparison: 01/30/2021

CLINICAL DATA: Pneumothorax.  Right chest tube.

EXAM:
PORTABLE CHEST 1 VIEW

[chest ap]
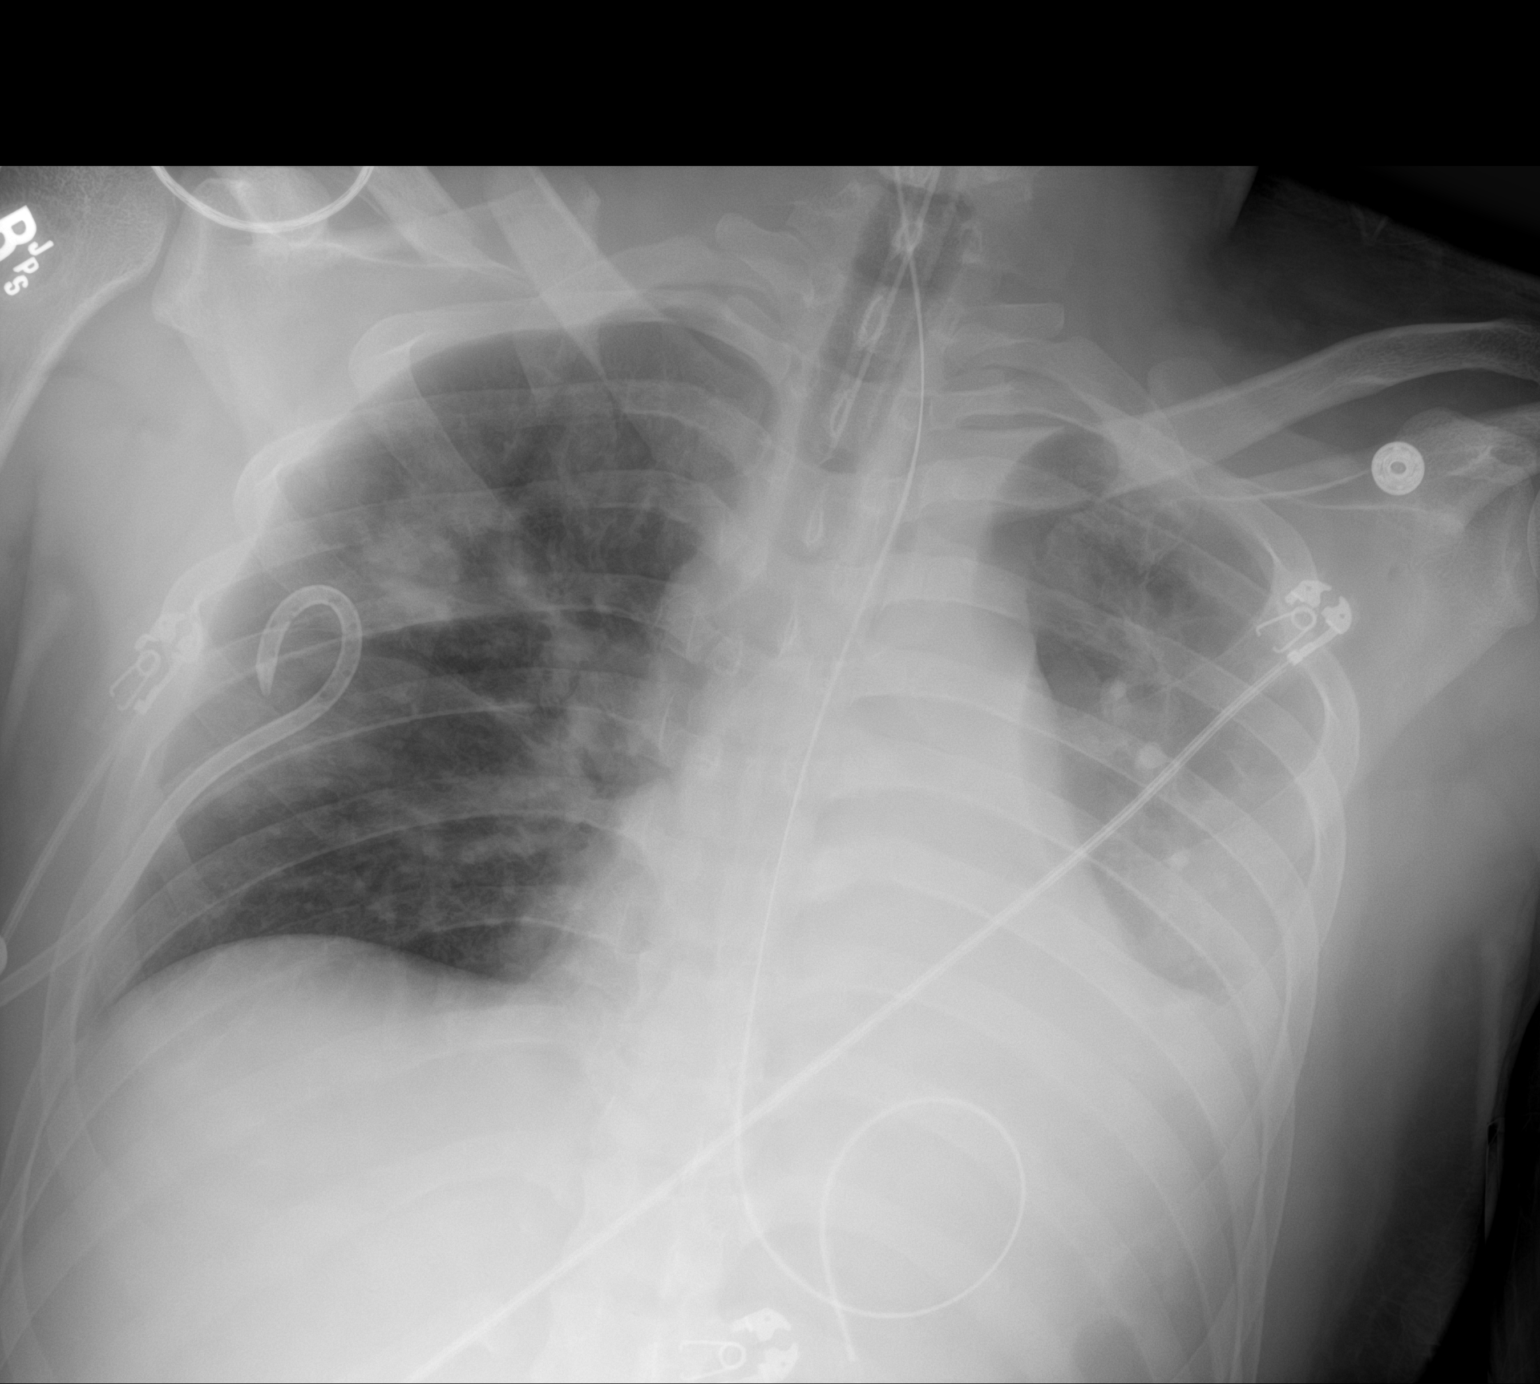

[1 of 1 positions shown; findings below may reference images not displayed]

FINDINGS: Endotracheal tube tip is 4 cm above the carina. Orogastric or
nasogastric tube enters the stomach. Right chest tube remains in
place. No visible pneumothorax. Mild pulmonary contusion on the
right. Persistent left lower lobe collapse. There may be increasing
pleural fluid on the left. No visible pneumothorax on the right.
Right clavicle fracture as seen previously.
IMPRESSION: Lines and tubes well positioned. No visible pneumothorax. Similar
appearance of right lung contusion. Left lower lobe collapse,
possibly with increasing pleural fluid on the left.
